# Patient Record
Sex: Female | Born: 1975 | Race: White | Hispanic: No | Marital: Single | Smoking: Current every day smoker
Health system: Southern US, Community
[De-identification: ages and names within clinical notes are randomized; demographics above are authoritative.]

## PROBLEM LIST (undated history)

## (undated) DIAGNOSIS — F419 Anxiety disorder, unspecified: Secondary | ICD-10-CM

## (undated) DIAGNOSIS — J449 Chronic obstructive pulmonary disease, unspecified: Secondary | ICD-10-CM

## (undated) DIAGNOSIS — I671 Cerebral aneurysm, nonruptured: Secondary | ICD-10-CM

## (undated) DIAGNOSIS — M797 Fibromyalgia: Secondary | ICD-10-CM

## (undated) DIAGNOSIS — F32A Depression, unspecified: Secondary | ICD-10-CM

## (undated) DIAGNOSIS — IMO0001 Reserved for inherently not codable concepts without codable children: Secondary | ICD-10-CM

## (undated) DIAGNOSIS — R569 Unspecified convulsions: Secondary | ICD-10-CM

## (undated) DIAGNOSIS — R471 Dysarthria and anarthria: Secondary | ICD-10-CM

## (undated) DIAGNOSIS — Z720 Tobacco use: Secondary | ICD-10-CM

## (undated) DIAGNOSIS — I10 Essential (primary) hypertension: Secondary | ICD-10-CM

## (undated) DIAGNOSIS — R519 Headache, unspecified: Secondary | ICD-10-CM

## (undated) DIAGNOSIS — R51 Headache: Secondary | ICD-10-CM

## (undated) DIAGNOSIS — K219 Gastro-esophageal reflux disease without esophagitis: Secondary | ICD-10-CM

## (undated) DIAGNOSIS — I639 Cerebral infarction, unspecified: Secondary | ICD-10-CM

## (undated) DIAGNOSIS — R270 Ataxia, unspecified: Secondary | ICD-10-CM

## (undated) DIAGNOSIS — F329 Major depressive disorder, single episode, unspecified: Secondary | ICD-10-CM

## (undated) HISTORY — PX: ROBOTIC ASSISTED TOTAL HYSTERECTOMY WITH SACROCOLPOPEXY: SHX6799

## (undated) HISTORY — PX: ABDOMINAL HYSTERECTOMY: SHX81

## (undated) HISTORY — PX: BRAIN SURGERY: SHX531

---

## 2004-08-14 ENCOUNTER — Emergency Department (HOSPITAL_COMMUNITY): Admission: EM | Admit: 2004-08-14 | Discharge: 2004-08-14 | Payer: Self-pay | Admitting: Emergency Medicine

## 2004-08-15 ENCOUNTER — Emergency Department (HOSPITAL_COMMUNITY): Admission: EM | Admit: 2004-08-15 | Discharge: 2004-08-15 | Payer: Self-pay | Admitting: Emergency Medicine

## 2004-08-15 ENCOUNTER — Inpatient Hospital Stay (HOSPITAL_COMMUNITY): Admission: AD | Admit: 2004-08-15 | Discharge: 2004-08-16 | Payer: Self-pay | Admitting: Obstetrics and Gynecology

## 2004-11-11 ENCOUNTER — Emergency Department (HOSPITAL_COMMUNITY): Admission: EM | Admit: 2004-11-11 | Discharge: 2004-11-11 | Payer: Self-pay | Admitting: Family Medicine

## 2004-11-12 ENCOUNTER — Encounter: Admission: RE | Admit: 2004-11-12 | Discharge: 2004-11-12 | Payer: Self-pay | Admitting: Sports Medicine

## 2006-10-05 ENCOUNTER — Emergency Department: Payer: Self-pay | Admitting: Emergency Medicine

## 2006-11-06 ENCOUNTER — Emergency Department: Payer: Self-pay | Admitting: Unknown Physician Specialty

## 2006-12-31 ENCOUNTER — Inpatient Hospital Stay: Payer: Self-pay | Admitting: Internal Medicine

## 2007-02-10 ENCOUNTER — Emergency Department: Payer: Self-pay | Admitting: Emergency Medicine

## 2007-04-05 ENCOUNTER — Emergency Department: Payer: Self-pay | Admitting: Emergency Medicine

## 2007-05-17 ENCOUNTER — Emergency Department: Payer: Self-pay | Admitting: Emergency Medicine

## 2007-05-17 ENCOUNTER — Other Ambulatory Visit: Payer: Self-pay

## 2007-05-18 ENCOUNTER — Emergency Department: Payer: Self-pay | Admitting: Emergency Medicine

## 2007-06-05 ENCOUNTER — Emergency Department: Payer: Self-pay | Admitting: Emergency Medicine

## 2007-07-11 ENCOUNTER — Emergency Department: Payer: Self-pay | Admitting: Emergency Medicine

## 2007-07-11 ENCOUNTER — Other Ambulatory Visit: Payer: Self-pay

## 2007-09-19 ENCOUNTER — Emergency Department: Payer: Self-pay | Admitting: Emergency Medicine

## 2008-01-11 ENCOUNTER — Emergency Department: Payer: Self-pay | Admitting: Emergency Medicine

## 2008-01-12 ENCOUNTER — Emergency Department: Payer: Self-pay | Admitting: Emergency Medicine

## 2008-02-19 ENCOUNTER — Emergency Department: Payer: Self-pay | Admitting: Emergency Medicine

## 2008-02-29 ENCOUNTER — Emergency Department: Payer: Self-pay | Admitting: Emergency Medicine

## 2008-04-12 ENCOUNTER — Emergency Department: Payer: Self-pay | Admitting: Emergency Medicine

## 2008-06-03 ENCOUNTER — Emergency Department: Payer: Self-pay | Admitting: Emergency Medicine

## 2008-10-30 ENCOUNTER — Emergency Department: Payer: Self-pay

## 2008-12-24 ENCOUNTER — Emergency Department: Payer: Self-pay | Admitting: Emergency Medicine

## 2009-01-03 ENCOUNTER — Ambulatory Visit: Payer: Self-pay | Admitting: Family Medicine

## 2009-01-10 ENCOUNTER — Ambulatory Visit: Payer: Self-pay

## 2009-01-16 ENCOUNTER — Ambulatory Visit: Payer: Self-pay

## 2009-01-23 ENCOUNTER — Ambulatory Visit: Payer: Self-pay

## 2009-01-23 ENCOUNTER — Emergency Department: Payer: Self-pay | Admitting: Unknown Physician Specialty

## 2009-01-31 ENCOUNTER — Ambulatory Visit: Payer: Self-pay

## 2009-02-13 ENCOUNTER — Emergency Department: Payer: Self-pay | Admitting: Emergency Medicine

## 2009-02-21 ENCOUNTER — Ambulatory Visit: Payer: Self-pay

## 2009-04-24 ENCOUNTER — Emergency Department: Payer: Self-pay | Admitting: Emergency Medicine

## 2009-05-16 ENCOUNTER — Emergency Department: Payer: Self-pay | Admitting: Emergency Medicine

## 2009-07-16 ENCOUNTER — Emergency Department: Payer: Self-pay | Admitting: Emergency Medicine

## 2009-10-03 ENCOUNTER — Emergency Department: Payer: Self-pay | Admitting: Emergency Medicine

## 2009-11-03 ENCOUNTER — Emergency Department: Payer: Self-pay | Admitting: Emergency Medicine

## 2009-11-05 ENCOUNTER — Inpatient Hospital Stay: Payer: Self-pay | Admitting: Specialist

## 2009-11-08 ENCOUNTER — Emergency Department: Payer: Self-pay | Admitting: Emergency Medicine

## 2010-02-12 ENCOUNTER — Other Ambulatory Visit: Payer: Self-pay | Admitting: Neurology

## 2010-03-08 ENCOUNTER — Emergency Department: Payer: Self-pay | Admitting: Emergency Medicine

## 2010-04-23 ENCOUNTER — Emergency Department: Payer: Self-pay | Admitting: Internal Medicine

## 2010-07-03 ENCOUNTER — Emergency Department: Payer: Self-pay | Admitting: Unknown Physician Specialty

## 2011-02-18 ENCOUNTER — Encounter: Payer: Self-pay | Admitting: Family Medicine

## 2011-08-14 IMAGING — CR DG CHEST 2V
1 series · 2 of 2 positions shown · non-contrast
Comparison: none

REASON FOR EXAM: cough
COMMENTS:

[Series 1: view not recorded · 0.17mm/px · 2 of 2 slices shown]
[im 1/2]
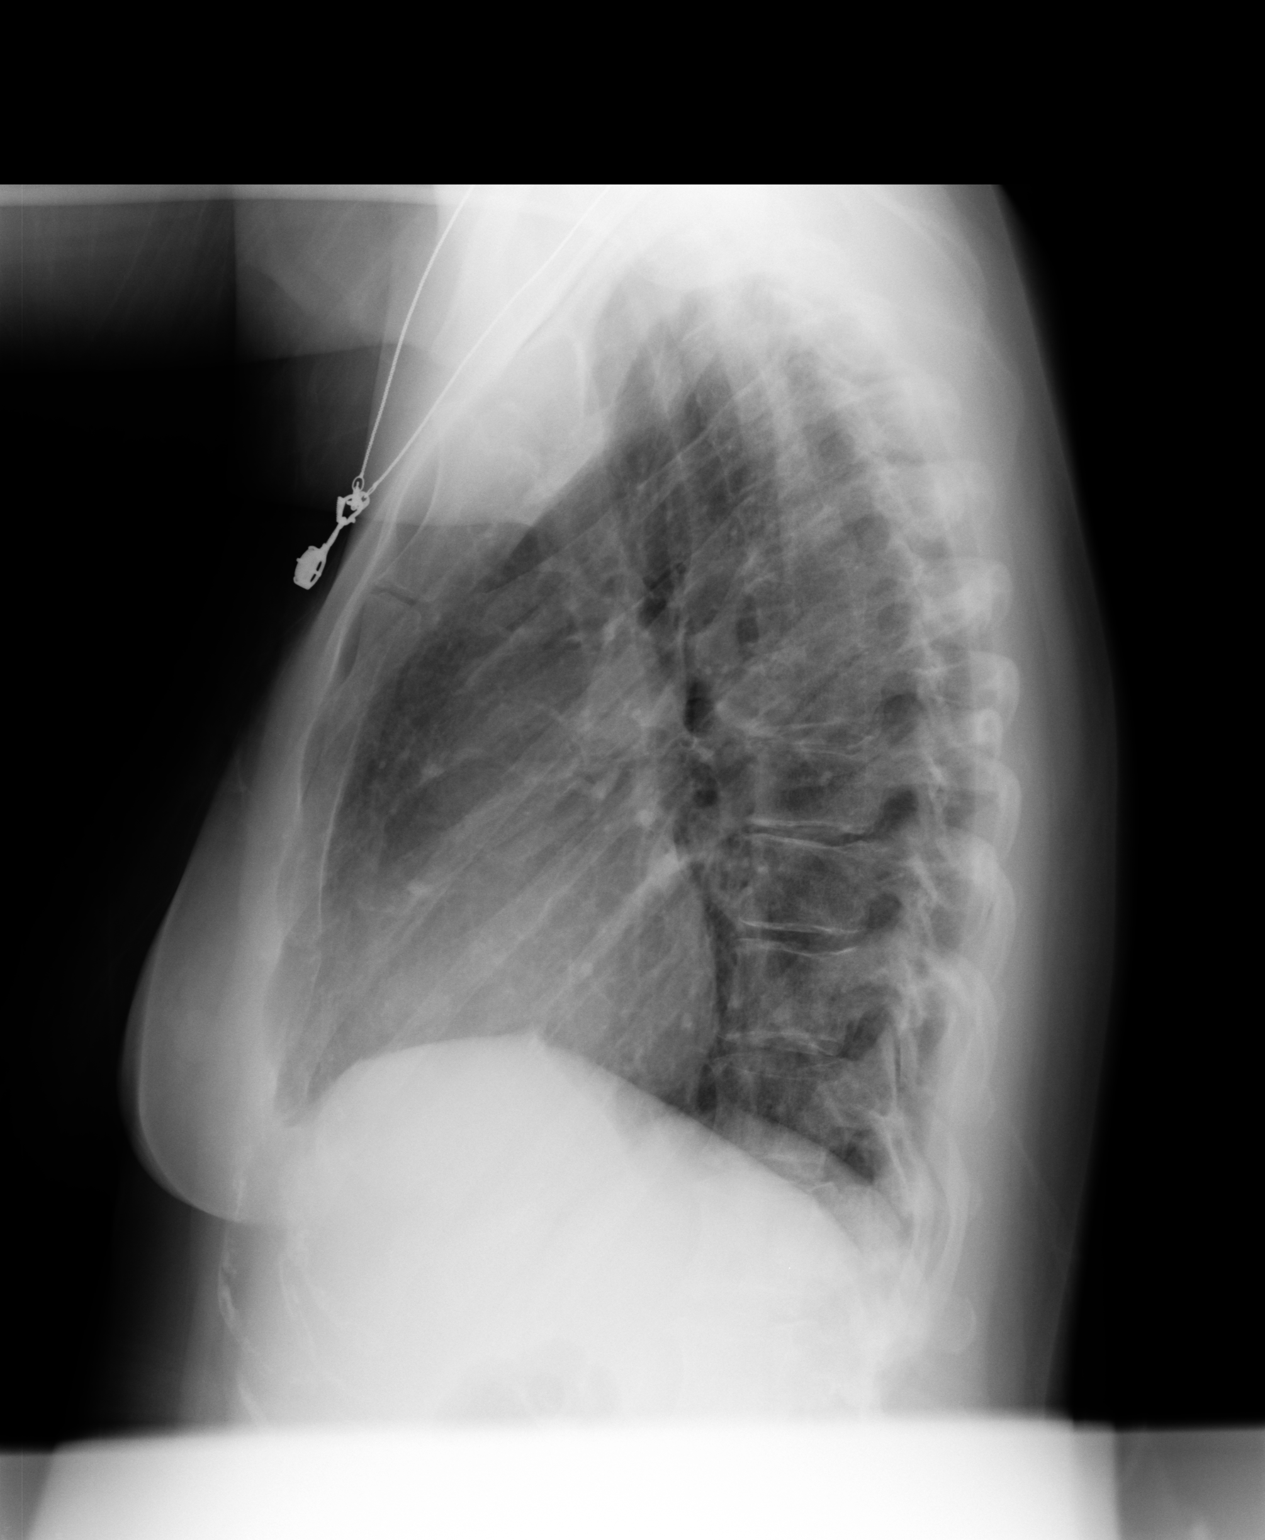
[im 2/2]
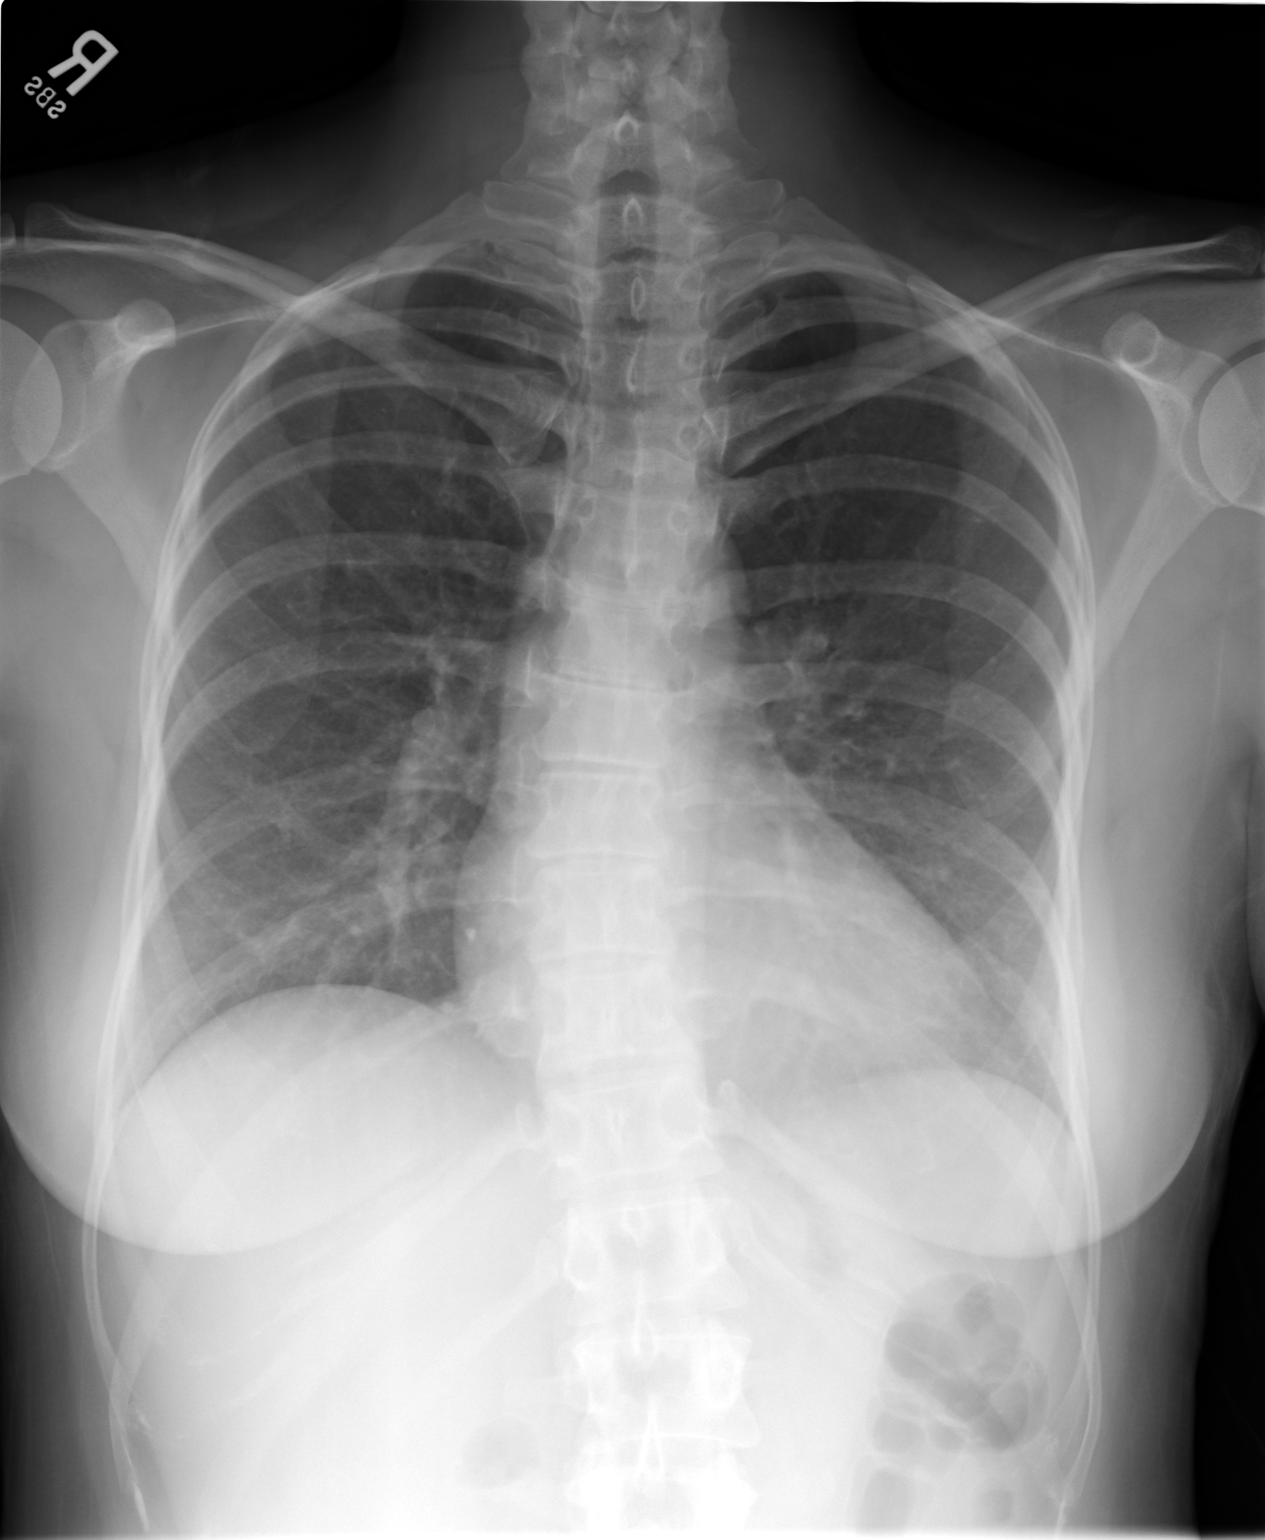

[2 of 2 positions shown; findings below may reference images not displayed]

PROCEDURE:     DXR - DXR CHEST PA (OR AP) AND LATERAL  - February 13, 2009  [DATE]

RESULT:     Comparison is made to the prior exam 10/30/2008. The current exam
shows the lung fields to be clear. The previously noted minimal infiltrate
at the right base is no longer seen. The heart is upper limits for normal in
size or mildly enlarged. No interstitial or pulmonary edema is noted. There
is a mild thoracic scoliosis with a convexity to the right.
IMPRESSION: No acute changes are identified.

## 2011-09-22 ENCOUNTER — Emergency Department: Payer: Self-pay | Admitting: Emergency Medicine

## 2011-09-22 LAB — CBC
HCT: 42.2 % (ref 35.0–47.0)
HGB: 14.2 g/dL (ref 12.0–16.0)
RDW: 15.2 % — ABNORMAL HIGH (ref 11.5–14.5)
WBC: 11 10*3/uL (ref 3.6–11.0)

## 2011-09-22 LAB — DRUG SCREEN, URINE
Barbiturates, Ur Screen: NEGATIVE (ref ?–200)
Cannabinoid 50 Ng, Ur ~~LOC~~: NEGATIVE (ref ?–50)
Cocaine Metabolite,Ur ~~LOC~~: NEGATIVE (ref ?–300)
Opiate, Ur Screen: NEGATIVE (ref ?–300)

## 2011-09-22 LAB — ETHANOL: Ethanol %: <0.003 % (ref 0.000–0.080)

## 2011-09-22 LAB — URINALYSIS, COMPLETE
Leukocyte Esterase: NEGATIVE
Nitrite: NEGATIVE
Ph: 5 (ref 4.5–8.0)
RBC,UR: <1 /HPF (ref 0–5)

## 2011-09-22 LAB — SALICYLATE LEVEL: Salicylates, Serum: 4.1 mg/dL — ABNORMAL HIGH

## 2011-09-22 LAB — COMPREHENSIVE METABOLIC PANEL
Alkaline Phosphatase: 62 U/L (ref 50–136)
Anion Gap: 9 (ref 7–16)
Calcium, Total: 9 mg/dL (ref 8.5–10.1)
Co2: 30 mmol/L (ref 21–32)
EGFR (African American): >60
Glucose: 88 mg/dL (ref 65–99)
Osmolality: 277 (ref 275–301)
SGOT(AST): 20 U/L (ref 15–37)
SGPT (ALT): 23 U/L

## 2011-09-22 LAB — ACETAMINOPHEN LEVEL: Acetaminophen: <2 ug/mL

## 2011-09-22 LAB — VALPROIC ACID LEVEL: Valproic Acid: <3 ug/mL — ABNORMAL LOW

## 2012-03-07 ENCOUNTER — Emergency Department: Payer: Self-pay | Admitting: Emergency Medicine

## 2012-03-07 LAB — VALPROIC ACID LEVEL: Valproic Acid: <3 ug/mL — ABNORMAL LOW

## 2012-03-07 LAB — BASIC METABOLIC PANEL
Anion Gap: 6 — ABNORMAL LOW (ref 7–16)
BUN: 9 mg/dL (ref 7–18)
Chloride: 102 mmol/L (ref 98–107)
Co2: 27 mmol/L (ref 21–32)
Creatinine: 0.74 mg/dL (ref 0.60–1.30)
Osmolality: 268 (ref 275–301)
Potassium: 3.2 mmol/L — ABNORMAL LOW (ref 3.5–5.1)
Sodium: 135 mmol/L — ABNORMAL LOW (ref 136–145)

## 2012-03-07 LAB — CBC WITH DIFFERENTIAL/PLATELET
Basophil #: 0 10*3/uL (ref 0.0–0.1)
HGB: 13.7 g/dL (ref 12.0–16.0)
MCV: 87 fL (ref 80–100)
Monocyte #: 0.6 x10 3/mm (ref 0.2–0.9)
Neutrophil %: 57.9 %
Platelet: 270 10*3/uL (ref 150–440)
RDW: 14.7 % — ABNORMAL HIGH (ref 11.5–14.5)

## 2012-08-10 ENCOUNTER — Emergency Department: Payer: Self-pay | Admitting: Emergency Medicine

## 2012-08-10 LAB — CBC
RBC: 4.68 10*6/uL (ref 3.80–5.20)
WBC: 10 10*3/uL (ref 3.6–11.0)

## 2012-08-10 LAB — BASIC METABOLIC PANEL
Anion Gap: 11 (ref 7–16)
Calcium, Total: 9 mg/dL (ref 8.5–10.1)
Chloride: 103 mmol/L (ref 98–107)
Co2: 23 mmol/L (ref 21–32)
Glucose: 152 mg/dL — ABNORMAL HIGH (ref 65–99)
Osmolality: 277 (ref 275–301)
Potassium: 3.4 mmol/L — ABNORMAL LOW (ref 3.5–5.1)

## 2012-08-10 LAB — TROPONIN I: Troponin-I: <0.02 ng/mL

## 2012-09-07 ENCOUNTER — Emergency Department: Payer: Self-pay | Admitting: Emergency Medicine

## 2012-10-19 ENCOUNTER — Ambulatory Visit: Payer: Self-pay | Admitting: Family Medicine

## 2013-05-30 ENCOUNTER — Ambulatory Visit: Payer: Self-pay | Admitting: Emergency Medicine

## 2013-09-10 ENCOUNTER — Emergency Department: Payer: Self-pay | Admitting: Internal Medicine

## 2013-09-10 LAB — BASIC METABOLIC PANEL
Anion Gap: 6 — ABNORMAL LOW (ref 7–16)
BUN: 8 mg/dL (ref 7–18)
CALCIUM: 8.9 mg/dL (ref 8.5–10.1)
CO2: 29 mmol/L (ref 21–32)
CREATININE: 0.79 mg/dL (ref 0.60–1.30)
Chloride: 102 mmol/L (ref 98–107)
EGFR (African American): >60
GLUCOSE: 98 mg/dL (ref 65–99)
Osmolality: 272 (ref 275–301)
POTASSIUM: 3.7 mmol/L (ref 3.5–5.1)
Sodium: 137 mmol/L (ref 136–145)

## 2013-09-10 LAB — CBC
HCT: 43.2 % (ref 35.0–47.0)
HGB: 14.7 g/dL (ref 12.0–16.0)
MCH: 29.9 pg (ref 26.0–34.0)
MCHC: 33.9 g/dL (ref 32.0–36.0)
MCV: 88 fL (ref 80–100)
Platelet: 303 10*3/uL (ref 150–440)
RBC: 4.9 10*6/uL (ref 3.80–5.20)
RDW: 13.9 % (ref 11.5–14.5)
WBC: 8.3 10*3/uL (ref 3.6–11.0)

## 2013-09-10 LAB — TROPONIN I: Troponin-I: <0.02 ng/mL

## 2013-09-10 LAB — D-DIMER(ARMC): D-Dimer: 351 ng/ml

## 2015-05-26 ENCOUNTER — Encounter: Payer: Self-pay | Admitting: Emergency Medicine

## 2015-05-26 ENCOUNTER — Ambulatory Visit
Admission: EM | Admit: 2015-05-26 | Discharge: 2015-05-26 | Disposition: A | Discharge status: Home / Self Care | Payer: Medicaid Other | Attending: Internal Medicine | Admitting: Internal Medicine

## 2015-05-26 DIAGNOSIS — J209 Acute bronchitis, unspecified: Secondary | ICD-10-CM

## 2015-05-26 DIAGNOSIS — R52 Pain, unspecified: Secondary | ICD-10-CM | POA: Diagnosis not present

## 2015-05-26 DIAGNOSIS — M791 Myalgia: Secondary | ICD-10-CM | POA: Diagnosis not present

## 2015-05-26 DIAGNOSIS — H65111 Acute and subacute allergic otitis media (mucoid) (sanguinous) (serous), right ear: Secondary | ICD-10-CM

## 2015-05-26 DIAGNOSIS — M7918 Myalgia, other site: Secondary | ICD-10-CM

## 2015-05-26 HISTORY — DX: Unspecified convulsions: R56.9

## 2015-05-26 MED ORDER — CYCLOBENZAPRINE HCL 5 MG PO TABS: 10.0000 mg | ORAL_TABLET | Freq: Three times a day (TID) | ORAL | Status: AC | PRN

## 2015-05-26 MED ORDER — METHYLPREDNISOLONE SODIUM SUCC 125 MG IJ SOLR: 80.0000 mg | Freq: Once | INTRAMUSCULAR | Status: DC

## 2015-05-26 MED ORDER — AZITHROMYCIN 250 MG PO TABS: 250.0000 mg | ORAL_TABLET | Freq: Every day | ORAL | Status: AC

## 2015-05-26 MED ORDER — METHYLPREDNISOLONE SODIUM SUCC 125 MG IJ SOLR
80.0000 mg | Freq: Once | INTRAMUSCULAR | Status: AC
  Administered 2015-05-26: 80 mg via INTRAVENOUS

## 2015-05-26 MED ORDER — METHYLPREDNISOLONE ACETATE 80 MG/ML IJ SUSP: 80.0000 mg | Freq: Once | INTRAMUSCULAR | Status: DC

## 2015-05-26 NOTE — Discharge Instructions (Signed)
Prescriptions for zithromax (antibiotic) and cyclobenzaprine (muscle relaxer) were sent to the [facility] [place].  An injection of solumedrol (methylprednisolone, a steroid) was given at the urgent care for bronchospasm. Recheck or followup pcp/Dr Mariana Kaufmanobin for persistent cough/wheezing, new fever >100.5, increasing phlegm production. Anticipate gradual improvement in achiness of neck/shoulders over the next several days.

## 2015-05-26 NOTE — ED Notes (Signed)
Patient c/o sinus pain and pressure for a week.  Patient c/o neck pain and stiffness for the past 4 days.

## 2015-05-26 NOTE — ED Provider Notes (Signed)
CSN: [id number]     Arrival date & time 05/26/15  1057 History   First MD Initiated Contact with Patient 05/26/15 1122     Chief Complaint  Patient presents with  . Neck Pain  . Facial Pain   HPI  40 yo lady with 4d hx respiratory sx's, prod cough, tactile temp, burning intranasal sensation (?dryness), some runny nose.  No sore throat.  Headache, some photophobia.  R neck/shoulder discomfort/achiness.   No N/V, no diarrhea; 2 episodes of fecal incontinence with hard coughing yesterday.  Past Medical History  Diagnosis Date  . Seizure [facility](HCC)    Past Surgical History  Procedure Laterality Date  . Brain surgery     History reviewed. No pertinent family history. Social History  Substance Use Topics  . Smoking status: Current Every Day Smoker    Types: Cigarettes  . Smokeless tobacco: None  . Alcohol Use: No    Review of Systems  All other systems reviewed and are negative.   Allergies  Chantix and Spiriva handihaler  Home Medications   Prior to Admission medications   Medication Sig Start Date End Date Taking? Authorizing Provider  clonazePAM (KLONOPIN) 0.5 MG tablet Take 0.5 mg by mouth at bedtime.   Yes Historical Provider, MD  hydrOXYzine (ATARAX/VISTARIL) 25 MG tablet Take 25 mg by mouth 2 (two) times daily.   Yes Historical Provider, MD  ibuprofen (ADVIL,MOTRIN) 800 MG tablet Take 800 mg by mouth daily.   Yes Historical Provider, MD  levETIRAcetam (KEPPRA) 750 MG tablet Take 1,500 mg by mouth daily.   Yes Historical Provider, MD  pregabalin (LYRICA) 100 MG capsule Take 100 mg by mouth 2 (two) times daily.   Yes Historical Provider, MD  propranolol (INDERAL) 20 MG tablet Take 20 mg by mouth daily.   Yes Historical Provider, MD  tiZANidine (ZANAFLEX) 2 MG tablet Take 2 mg by mouth once.   Yes Historical Provider, MD  traMADol (ULTRAM) 50 MG tablet Take 50 mg by mouth daily.   Yes Historical Provider, MD  cyclobenzaprine (FLEXERIL) 5 MG tablet Take 2 tablets (10 mg total)  by mouth 3 (three) times daily as needed for muscle spasms. 05/26/15 06/05/15  Eustace MooreLaura W Ellen Goris, MD   Meds Ordered and Administered this Visit   Medications  methylPREDNISolone sodium succinate (SOLU-MEDROL) 125 mg/2 mL injection 80 mg (80 mg Intravenous Given 05/26/15 1154)  Note:  Solumedrol was given IM not iv   BP 132/88 mmHg  Pulse 83  Temp(Src) 98.2 F (36.8 C) (Tympanic)  Resp 16  Ht 5\' 9"  (1.753 m)  Wt 205 lb (92.987 kg)  BMI 30.26 kg/m2  SpO2 95%  LMP 05/26/2015 (Approximate)  Physical Exam  Constitutional: She is oriented to person, place, and time.  Alert, nicely groomed Laying down on exam table but able to sit up for exam Looks ill but not toxic  HENT:  Head: Atraumatic.  R TM red/dull L TM dull Mod nasal congestion Throat slightly red   Eyes:  Dysconjugate gaze, R eye deviates laterally; no eye redness/drainage  Neck:  Hurts to turn neck to right Able to flex/extend neck but uncomfortable No rash Diffuse R paracervical/R trapezius discomfort to palpation  Cardiovascular: Normal rate and regular rhythm.   Pulmonary/Chest: No respiratory distress. She has no wheezes. She has no rales.  Coarse but symmetric breath sounds throughoug  Abdominal: She exhibits no distension.  Musculoskeletal: Normal range of motion.  No leg swelling  Neurological: She is alert and oriented to person,  place, and time.  Skin: Skin is warm and dry.  No cyanosis  Nursing note and vitals reviewed.   ED Course  Procedures (including critical care time)  None today  MDM   1. Acute bronchitis, unspecified organism   2. Acute mucoid otitis media of right ear   3. Acute myofascial pain     Meds this encounter   . methylPREDNISolone acetate (DEPO-MEDROL) injection 80 mg      . cyclobenzaprine (FLEXERIL) 5 MG tablet    Sig: Take 2 tablets (10 mg total) by mouth 3 (three) times daily as needed for muscle spasms.    Dispense:  30 tablet    Refill:  0  . azithromycin  (ZITHROMAX) 250 MG tablet    Sig: Take 1 tablet (250 mg total) by mouth daily. Take first 2 tablets together, then 1 every day until finished.    Dispense:  6 tablet    Refill:  0   Recheck or followup pcp/Dr Mariana Kaufman for new fever >100.5, increasing phlegm production, or persistent cough/wheezing. Anticipate gradual improvement in achiness of neck/shoulders over the next several days.  Eustace Moore, MD 06/01/15 [phone]

## 2015-10-12 ENCOUNTER — Ambulatory Visit: Payer: Medicare Other

## 2015-10-12 ENCOUNTER — Encounter: Payer: Self-pay | Admitting: Gynecology

## 2015-10-12 ENCOUNTER — Ambulatory Visit
Admission: EM | Admit: 2015-10-12 | Discharge: 2015-10-12 | Disposition: A | Discharge status: Home / Self Care | Payer: Medicare Other | Attending: Family Medicine | Admitting: Family Medicine

## 2015-10-12 DIAGNOSIS — M797 Fibromyalgia: Secondary | ICD-10-CM | POA: Diagnosis not present

## 2015-10-12 DIAGNOSIS — G40909 Epilepsy, unspecified, not intractable, without status epilepticus: Secondary | ICD-10-CM | POA: Insufficient documentation

## 2015-10-12 DIAGNOSIS — F17210 Nicotine dependence, cigarettes, uncomplicated: Secondary | ICD-10-CM | POA: Insufficient documentation

## 2015-10-12 DIAGNOSIS — K219 Gastro-esophageal reflux disease without esophagitis: Secondary | ICD-10-CM | POA: Insufficient documentation

## 2015-10-12 DIAGNOSIS — R05 Cough: Secondary | ICD-10-CM | POA: Diagnosis present

## 2015-10-12 DIAGNOSIS — F419 Anxiety disorder, unspecified: Secondary | ICD-10-CM | POA: Diagnosis not present

## 2015-10-12 DIAGNOSIS — F329 Major depressive disorder, single episode, unspecified: Secondary | ICD-10-CM | POA: Insufficient documentation

## 2015-10-12 DIAGNOSIS — Z79899 Other long term (current) drug therapy: Secondary | ICD-10-CM | POA: Diagnosis not present

## 2015-10-12 DIAGNOSIS — J449 Chronic obstructive pulmonary disease, unspecified: Secondary | ICD-10-CM | POA: Insufficient documentation

## 2015-10-12 DIAGNOSIS — Z7982 Long term (current) use of aspirin: Secondary | ICD-10-CM | POA: Insufficient documentation

## 2015-10-12 DIAGNOSIS — J069 Acute upper respiratory infection, unspecified: Secondary | ICD-10-CM | POA: Insufficient documentation

## 2015-10-12 DIAGNOSIS — Z8673 Personal history of transient ischemic attack (TIA), and cerebral infarction without residual deficits: Secondary | ICD-10-CM | POA: Insufficient documentation

## 2015-10-12 HISTORY — DX: Essential (primary) hypertension: I10

## 2015-10-12 HISTORY — DX: Depression, unspecified: F32.A

## 2015-10-12 HISTORY — DX: Anxiety disorder, unspecified: F41.9

## 2015-10-12 HISTORY — DX: Cerebral infarction, unspecified: I63.9

## 2015-10-12 HISTORY — DX: Gastro-esophageal reflux disease without esophagitis: K21.9

## 2015-10-12 HISTORY — DX: Headache: R51

## 2015-10-12 HISTORY — DX: Chronic obstructive pulmonary disease, unspecified: J44.9

## 2015-10-12 HISTORY — DX: Headache, unspecified: R51.9

## 2015-10-12 HISTORY — DX: Reserved for inherently not codable concepts without codable children: IMO0001

## 2015-10-12 HISTORY — DX: Cerebral aneurysm, nonruptured: I67.1

## 2015-10-12 HISTORY — DX: Fibromyalgia: M79.7

## 2015-10-12 HISTORY — DX: Ataxia, unspecified: R27.0

## 2015-10-12 HISTORY — DX: Dysarthria and anarthria: R47.1

## 2015-10-12 HISTORY — DX: Tobacco use: Z72.0

## 2015-10-12 HISTORY — DX: Major depressive disorder, single episode, unspecified: F32.9

## 2015-10-12 MED ORDER — METHYLPREDNISOLONE 4 MG PO TBPK: ORAL_TABLET | ORAL | 0 refills | Status: DC

## 2015-10-12 NOTE — ED Provider Notes (Signed)
[facility]    CSN: [id number] Arrival date & time: 10/12/15  1233  First Provider Contact:  None       History   Chief Complaint Chief Complaint  Patient presents with  . Cough    HPI Tona Sensingita L Viveros is a 40 y.o. female.   HPI: Patient presents today with symptoms of persistent productive cough. Patient has been treated with Augmentin and Zithromax for bronchitis. Patient states that she has a history of COPD. She is a smoker. She denies any chest pain or increased shortness of breath. She admits to minimal nasal drainage and no sore throat. She believes she has had a low-grade fever. No lower extremity edema or pain. She has been taking her inhalers as prescribed.  Past Medical History:  Diagnosis Date  . Aneurysm of anterior cerebral artery   . Anxiety   . Ataxia   . COPD (chronic obstructive pulmonary disease) (HCC)   . Depression   . Dysarthria   . Epilepsy (HCC)   . Fibromyalgia   . GERD (gastroesophageal reflux disease)   . Head ache   . Hypertension   . Myalgia and myositis   . Seizure (HCC)   . Stroke (HCC)   . Tobacco abuse     There are no active problems to display for this patient.   Past Surgical History:  Procedure Laterality Date  . BRAIN SURGERY      OB History    No data available       Home Medications    Prior to Admission medications   Medication Sig Start Date End Date Taking? Authorizing Provider  albuterol (ACCUNEB) 0.63 MG/3ML nebulizer solution Take 1 ampule by nebulization every 6 (six) hours as needed for wheezing.   Yes Historical Provider, MD  albuterol (PROAIR HFA) 108 (90 Base) MCG/ACT inhaler Inhale into the lungs every 6 (six) hours as needed for wheezing or shortness of breath.   Yes Historical Provider, MD  albuterol (PROVENTIL) (2.5 MG/3ML) 0.083% nebulizer solution Take 2.5 mg by nebulization every 6 (six) hours as needed for wheezing or shortness of breath.   Yes Historical Provider, MD  aspirin 325 MG  tablet Take 325 mg by mouth daily.   Yes Historical Provider, MD  cetirizine (ZYRTEC) 10 MG tablet Take 10 mg by mouth daily.   Yes Historical Provider, MD  clonazePAM (KLONOPIN) 0.5 MG tablet Take 0.5 mg by mouth at bedtime.   Yes Historical Provider, MD  fluticasone (FLOVENT HFA) 110 MCG/ACT inhaler Inhale into the lungs 2 (two) times daily.   Yes Historical Provider, MD  furosemide (LASIX) 40 MG tablet Take 40 mg by mouth.   Yes Historical Provider, MD  hydrOXYzine (ATARAX/VISTARIL) 25 MG tablet Take 25 mg by mouth 2 (two) times daily.   Yes Historical Provider, MD  ibuprofen (ADVIL,MOTRIN) 800 MG tablet Take 800 mg by mouth daily.   Yes Historical Provider, MD  ketoconazole (NIZORAL) 2 % cream Apply 1 application topically daily.   Yes Historical Provider, MD  levETIRAcetam (KEPPRA) 750 MG tablet Take 1,500 mg by mouth daily.   Yes Historical Provider, MD  pantoprazole (PROTONIX) 40 MG tablet Take 40 mg by mouth daily.   Yes Historical Provider, MD  pregabalin (LYRICA) 100 MG capsule Take 100 mg by mouth 2 (two) times daily.   Yes Historical Provider, MD  propranolol (INDERAL) 20 MG tablet Take 20 mg by mouth daily.   Yes Historical Provider, MD  sertraline (ZOLOFT) 25 MG tablet Take  25 mg by mouth daily.   Yes Historical Provider, MD  tiZANidine (ZANAFLEX) 2 MG tablet Take 2 mg by mouth once.   Yes Historical Provider, MD  traMADol (ULTRAM) 50 MG tablet Take 50 mg by mouth daily.   Yes Historical Provider, MD    Family History No family history on file.  Social History Social History  Substance Use Topics  . Smoking status: Current Every Day Smoker    Types: Cigarettes  . Smokeless tobacco: Never Used  . Alcohol use No     Allergies   Chantix [varenicline] and Spiriva handihaler [tiotropium bromide monohydrate]   Review of Systems Review of Systems: Negative except mentioned above.   Physical Exam Triage Vital Signs ED Triage Vitals  Enc Vitals Group     BP 10/12/15  1310 121/77     Pulse Rate 10/12/15 1310 100     Resp 10/12/15 1310 16     Temp 10/12/15 1310 99 F (37.2 C)     Temp Source 10/12/15 1310 Oral     SpO2 10/12/15 1310 98 %     Weight 10/12/15 1312 212 lb (96.2 kg)     Height 10/12/15 1312 5\' 8"  (1.727 m)     Head Circumference --      Peak Flow --      Pain Score 10/12/15 1320 9     Pain Loc --      Pain Edu? --      Excl. in GC? --    No data found.   Updated Vital Signs BP 121/77 (BP Location: Left Arm)   Pulse 100   Temp 99 F (37.2 C) (Oral)   Resp 16   Ht 5\' 8"  (1.727 m)   Wt 212 lb (96.2 kg)   LMP 09/08/2015 Comment: irregular periods  SpO2 98%   BMI 32.23 kg/m      Physical Exam:   GENERAL: NAD HEENT: mild pharyngeal erythema, no exudate, no erythema of TMs, no cervical LAD RESP: minimal expiratory wheeze at bases, no tachypnea, no accessory muscle use CARD: RRR EXTREM: -Homans    UC Treatments / Results  Labs (all labs ordered are listed, but only abnormal results are displayed) Labs Reviewed - No data to display  EKG  EKG Interpretation None       Radiology No results found.  Procedures Procedures (including critical care time)  Medications Ordered in UC Medications - No data to display   Initial Impression / Assessment and Plan / UC Course  I have reviewed the triage vital signs and the nursing notes.  Pertinent labs & imaging results that were available during my care of the patient were reviewed by me and considered in my medical decision making (see chart for details).  Clinical Course   A/P: URI, COPD - discussed with patient results of the x-ray, recommend patient monitor fever, no new antibiotic was given at this time, if patient's symptoms do worsen I do recommend that she follow up with her primary care physician or go to the ER, will prescribe patient tapered dose of prednisone, over-the-counter cough suppressant when necessary, continue to use her inhalers as prescribed,  smoking cessation encouraged.  Final Clinical Impressions(s) / UC Diagnoses   Final diagnoses:  None    New Prescriptions New Prescriptions   No medications on file     Jolene ProvostKirtida Karra Pink, MD 10/12/15 1437

## 2015-10-12 NOTE — ED Triage Notes (Signed)
Patient c/o was seen by her doctor on 09/26/2015 and dx with acute bronchitis. Per patient given antibiotics which she finished on 8/13l17. Patient stated still using the inhales that was given to her. However patient stated coughing thick green mucous.

## 2015-10-12 NOTE — Discharge Instructions (Signed)
Follow-up with her primary care physician as discussed. Continue using her inhalers as prescribed. Smoking cessation encouraged. Seek immediate medical attention if symptoms worsen as discussed

## 2015-12-08 ENCOUNTER — Telehealth: Payer: Self-pay | Admitting: Gynecology

## 2015-12-08 ENCOUNTER — Ambulatory Visit
Admission: EM | Admit: 2015-12-08 | Discharge: 2015-12-08 | Disposition: A | Discharge status: Home / Self Care | Payer: Medicare Other | Attending: Family Medicine | Admitting: Family Medicine

## 2015-12-08 ENCOUNTER — Encounter: Payer: Self-pay | Admitting: *Deleted

## 2015-12-08 DIAGNOSIS — R1030 Lower abdominal pain, unspecified: Secondary | ICD-10-CM | POA: Diagnosis not present

## 2015-12-08 DIAGNOSIS — R197 Diarrhea, unspecified: Secondary | ICD-10-CM

## 2015-12-08 NOTE — ED Provider Notes (Signed)
[facility] ____________________________________________  Time seen: Approximately 2:05 PM  I have reviewed the triage vital signs and the nursing notes.   HISTORY Chief Complaint Hematochezia and Abdominal Pain  HPI Anna Ayala is a 40 y.o. female presents with husband at bedside for the complaints of lower abdominal pain as well has cutting diarrhea and blood in stool. Patient reports is been present for the last 2 days. Patient reports last night she had more than 10 diarrheal episodes, and reports 6-7 diarrheal episodes today. Reports fall diarrhea episodes have blood in stool as well as blood on the toilet. Patient reports "chunks of blood" in the toilet. Denies history of this extensive bleeding in toilet or stool. Patient reports she does have a history of diverticulitis with similar abdominal pain however states that this is more diffuse across her abdomen. Patient states that she called her primary doctor on call and they recommend for her to be evaluated. Patient states that she is here as she feels that she needs an antibiotic for diverticulitis.   patient states current abdominal pain is 7 out of 10 across lower abdomen. Denies pain radiation. 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 Denies any trauma from a seizure. Denies any recent medication changes. Denies any known triggers. Denies any other symptoms household with similar. Denies vomiting, but reports some nausea. Denies any other abnormal bleeding. Denies recent sickness, fevers, chest pain, shortness of breath, dizziness or weakness.  [facility]: PCP   Past Medical History:  Diagnosis Date  . Aneurysm of anterior cerebral artery   . Anxiety   . Ataxia   . COPD (chronic obstructive pulmonary disease) (HCC)   . Depression   . Dysarthria   . Epilepsy (HCC)   . Fibromyalgia   . GERD (gastroesophageal reflux disease)   .  Head ache   . Hypertension   . Myalgia and myositis   . Seizure (HCC)   . Stroke (HCC)   . Tobacco abuse     There are no active problems to display for this patient.   Past Surgical History:  Procedure Laterality Date  . BRAIN SURGERY      Current Outpatient Rx  . Order #: [id number] Class: Historical Med  . Order #: [id number] Class: Historical Med  . Order #: [id number] Class: Historical Med  . Order #: [id number] Class: Historical Med  . Order #: [id number] Class: Historical Med  . Order #: [id number] Class: Historical Med  . Order #: [id number] Class: Historical Med  . Order #: [id number] Class: Historical Med  . Order #: [id number] Class: Historical Med  . Order #: [id number] Class: Historical Med  . Order #: [id number] Class: Historical Med  . Order #: [id number] Class: Historical Med  . Order #: [id number] Class: Historical Med  . Order #: [id number] Class: Historical Med  . Order #: [id number] Class: Historical Med  . Order #: [id number] Class: Historical Med  . Order #: [id number] Class: Historical Med  . Order #: [id number] Class: Historical Med  . Order #: [id number] Class: Normal    No current facility-administered medications for this encounter.   Current Outpatient Prescriptions:  .  albuterol (ACCUNEB) 0.63 MG/3ML nebulizer solution, Take 1 ampule by nebulization every 6 (six) hours as needed for wheezing., Disp: , Rfl:  .  albuterol (PROAIR HFA) 108 (90 Base) MCG/ACT inhaler, Inhale into the lungs every 6 (six) hours as needed for wheezing or shortness of breath., Disp: , Rfl:  .  albuterol (PROVENTIL) (2.5 MG/3ML) 0.083% nebulizer solution, Take 2.5  mg by nebulization every 6 (six) hours as needed for wheezing or shortness of breath., Disp: , Rfl:  .  aspirin 325 MG tablet, Take 325 mg by mouth daily., Disp: , Rfl:  .  cetirizine (ZYRTEC) 10 MG tablet, Take 10 mg by mouth daily., Disp: , Rfl:  .  clonazePAM (KLONOPIN) 0.5 MG tablet, Take 0.5 mg by mouth at bedtime., Disp: , Rfl:  .   fluticasone (FLOVENT HFA) 110 MCG/ACT inhaler, Inhale into the lungs 2 (two) times daily., Disp: , Rfl:  .  furosemide (LASIX) 40 MG tablet, Take 40 mg by mouth., Disp: , Rfl:  .  hydrOXYzine (ATARAX/VISTARIL) 25 MG tablet, Take 25 mg by mouth 2 (two) times daily., Disp: , Rfl:  .  ibuprofen (ADVIL,MOTRIN) 800 MG tablet, Take 800 mg by mouth daily., Disp: , Rfl:  .  ketoconazole (NIZORAL) 2 % cream, Apply 1 application topically daily., Disp: , Rfl:  .  levETIRAcetam (KEPPRA) 750 MG tablet, Take 1,500 mg by mouth daily., Disp: , Rfl:  .  pantoprazole (PROTONIX) 40 MG tablet, Take 40 mg by mouth daily., Disp: , Rfl:  .  pregabalin (LYRICA) 100 MG capsule, Take 100 mg by mouth 2 (two) times daily., Disp: , Rfl:  .  propranolol (INDERAL) 20 MG tablet, Take 20 mg by mouth daily., Disp: , Rfl:  .  sertraline (ZOLOFT) 25 MG tablet, Take 25 mg by mouth daily., Disp: , Rfl:  .  tiZANidine (ZANAFLEX) 2 MG tablet, Take 2 mg by mouth once., Disp: , Rfl:  .  traMADol (ULTRAM) 50 MG tablet, Take 50 mg by mouth daily., Disp: , Rfl:  .  methylPREDNISolone (MEDROL DOSEPAK) 4 MG TBPK tablet, Use for 6 days as on box., Disp: 21 tablet, Rfl: 0  Allergies Chantix [varenicline] and Spiriva handihaler [tiotropium bromide monohydrate]  History reviewed. No pertinent family history.  Social History Social History  Substance Use Topics  . Smoking status: Current Every Day Smoker    Types: Cigarettes  . Smokeless tobacco: Never Used  . Alcohol use No    Review of Systems Constitutional: No fever/chills Eyes: No visual changes. ENT: No sore throat. Cardiovascular: Denies chest pain. Respiratory: Denies shortness of breath. Gastrointestinal: As above.  No constipation. Genitourinary: Negative for dysuria. Musculoskeletal: Negative for back pain. Skin: Negative for rash. Neurological: Negative for headaches, focal weakness or numbness.  10-point ROS otherwise  negative.  ____________________________________________   PHYSICAL EXAM:  VITAL SIGNS: ED Triage Vitals  Enc Vitals Group     BP 12/08/15 1343 118/73     Pulse Rate 12/08/15 1343 92     Resp 12/08/15 1343 16     Temp 12/08/15 1343 98.2 F (36.8 C)     Temp Source 12/08/15 1343 Oral     SpO2 12/08/15 1343 94 %     Weight 12/08/15 1346 185 lb (83.9 kg)     Height 12/08/15 1346 5\' 9"  (1.753 m)     Head Circumference --      Peak Flow --      Pain Score --      Pain Loc --      Pain Edu? --      Excl. in GC? --     Constitutional: Alert and oriented. Well appearing and in no acute distress. Eyes: Conjunctivae are normal. PERRL. EOMI. ENT      Head: Normocephalic and atraumatic.      Nose: No congestion/rhinnorhea.      Mouth/Throat: Mucous membranes are  moist.Oropharynx non-erythematous. Hematological/Lymphatic/Immunilogical: No cervical lymphadenopathy. Cardiovascular: Normal rate, regular rhythm. Grossly normal heart sounds.  Good peripheral circulation. Respiratory: Normal respiratory effort without tachypnea nor retractions. Breath sounds are clear and equal bilaterally. No wheezes/rales/rhonchi.. Gastrointestinal: Moderate diffuse lower abdomen tenderness. Normal Bowel sounds. No CVA tenderness. Musculoskeletal:  Ambulatory with steady gait. No midline cervical, thoracic or lumbar tenderness to palpation.  Neurologic:  Normal speech and language. No gross focal neurologic deficits are appreciated. Speech is normal. No gait instability.  Skin:  Skin is warm, dry and intact. No rash noted. Psychiatric: Mood and affect are normal. Speech and behavior are normal. Patient exhibits appropriate insight and judgment   ___________________________________________   LABS (all labs ordered are listed, but only abnormal results are displayed)  Labs Reviewed - No data to display ____________________________________________  RADIOLOGY  No results  found. ____________________________________________   PROCEDURES Procedures    INITIAL IMPRESSION / ASSESSMENT AND PLAN / ED COURSE  Pertinent labs & imaging results that were available during my care of the patient were reviewed by me and considered in my medical decision making (see chart for details).  Overall well-appearing patient. Presents for the complaints of abdominal pain and bloody diarrhea since last night. Due to abdominal tenderness and patient report of copious blood in stool recommend patient be further evaluation and likely CT and treated emergency room of her choice at this time. Recommended for patient to go directly to the emergency room. Patient alert and oriented with decisional capacity and states that her husband will drive her to the ER. Patient states that she will [facility]. Camille CMA called and report given. Patient stable at the time of discharge.   Discussed follow up with Primary care physician this week. Discussed follow up and return parameters including no resolution or any worsening concerns. Patient verbalized understanding and agreed to plan.   ____________________________________________   FINAL CLINICAL IMPRESSION(S) / ED DIAGNOSES  Final diagnoses:  Lower abdominal pain  Bloody diarrhea     Discharge Medication List as of 12/08/2015  2:34 PM      Note: This dictation was prepared with Dragon dictation along with smaller phrase technology. Any transcriptional errors that result from this process are unintentional.    Clinical Course      Renford Dills, NP 12/08/15 1722    Renford Dills, NP 12/08/15 1722

## 2015-12-08 NOTE — ED Triage Notes (Signed)
Pt here with with RLQ abd pain x2 days. Also bloody stools. Hx of diverticultis. Pt spoke with PCP last night and was advised to come here.

## 2016-08-30 ENCOUNTER — Emergency Department
Admission: EM | Admit: 2016-08-30 | Discharge: 2016-08-30 | Disposition: A | Discharge status: Home / Self Care | Payer: Medicare Other | Attending: Emergency Medicine | Admitting: Emergency Medicine

## 2016-08-30 ENCOUNTER — Encounter: Payer: Self-pay | Admitting: Emergency Medicine

## 2016-08-30 DIAGNOSIS — F17210 Nicotine dependence, cigarettes, uncomplicated: Secondary | ICD-10-CM | POA: Diagnosis not present

## 2016-08-30 DIAGNOSIS — J449 Chronic obstructive pulmonary disease, unspecified: Secondary | ICD-10-CM | POA: Diagnosis not present

## 2016-08-30 DIAGNOSIS — Z79899 Other long term (current) drug therapy: Secondary | ICD-10-CM | POA: Diagnosis not present

## 2016-08-30 DIAGNOSIS — Z7982 Long term (current) use of aspirin: Secondary | ICD-10-CM | POA: Diagnosis not present

## 2016-08-30 DIAGNOSIS — T63441A Toxic effect of venom of bees, accidental (unintentional), initial encounter: Secondary | ICD-10-CM

## 2016-08-30 DIAGNOSIS — Z7951 Long term (current) use of inhaled steroids: Secondary | ICD-10-CM | POA: Insufficient documentation

## 2016-08-30 DIAGNOSIS — T7840XA Allergy, unspecified, initial encounter: Secondary | ICD-10-CM

## 2016-08-30 DIAGNOSIS — Z8673 Personal history of transient ischemic attack (TIA), and cerebral infarction without residual deficits: Secondary | ICD-10-CM | POA: Insufficient documentation

## 2016-08-30 DIAGNOSIS — R0602 Shortness of breath: Secondary | ICD-10-CM | POA: Diagnosis present

## 2016-08-30 DIAGNOSIS — I10 Essential (primary) hypertension: Secondary | ICD-10-CM | POA: Diagnosis not present

## 2016-08-30 MED ORDER — EPINEPHRINE 0.3 MG/0.3ML IJ SOAJ: 0.3000 mg | Freq: Once | INTRAMUSCULAR | 1 refills | Status: AC

## 2016-08-30 MED ORDER — FAMOTIDINE IN NACL 20-0.9 MG/50ML-% IV SOLN
20.0000 mg | Freq: Once | INTRAVENOUS | Status: AC
  Administered 2016-08-30: 20 mg via INTRAVENOUS
  Filled 2016-08-30: qty 50

## 2016-08-30 MED ORDER — METHYLPREDNISOLONE SODIUM SUCC 125 MG IJ SOLR
125.0000 mg | Freq: Once | INTRAMUSCULAR | Status: AC
  Administered 2016-08-30: 125 mg via INTRAVENOUS
  Filled 2016-08-30: qty 2

## 2016-08-30 MED ORDER — PREDNISONE 20 MG PO TABS: 60.0000 mg | ORAL_TABLET | Freq: Every day | ORAL | 0 refills | Status: AC

## 2016-08-30 MED ORDER — ALBUTEROL SULFATE (2.5 MG/3ML) 0.083% IN NEBU
5.0000 mg | INHALATION_SOLUTION | Freq: Once | RESPIRATORY_TRACT | Status: AC
  Administered 2016-08-30: 5 mg via RESPIRATORY_TRACT
  Filled 2016-08-30: qty 6

## 2016-08-30 NOTE — ED Triage Notes (Signed)
Patient presents to the ED via EMS from home after being stung by bees in her upper back and complaint of abdominal pain.  Per EMS family gave patient 50mg  benadryl prior to calling EMS.  Patient is having no obvious difficulty breathing.  Patient has history of diverticulitis and is complaining of abdominal pain that feels similar.  Patient is very difficult to understand as she has slurred speech, EMS is unsure if this is baseline or not.  Patient immediately stated she wanted to go to the bathroom on arrival to the ED.  Patient sitting on toilet for approx. 8 min. Trying to have a bowel movement.

## 2016-08-30 NOTE — ED Provider Notes (Signed)
[facility] Emergency Department Provider Note  ____________________________________________  Time seen: Approximately 8:04 AM  I have reviewed the triage vital signs and the nursing notes.   HISTORY  Chief Complaint Insect Bite   HPI Anna Ayala is a 41 y.o. female who presents for evaluation of an insect bite. Patient has a history of anaphylaxis to bee stings. She was walking outside her house today when she got stung twice by a bee. She started having shortness of breath which prompted EMS to be called. She has a history of COPD. Upon EMS arrival patient was noted to be wheezing and received 2.5 mg of albuterol. She reports that her shortness of breath is improved but is still persistent. She denies tongue swelling, angioedema, difficulty swallowing, vomiting or diarrhea. No hives. She reports her SOB is currently mild and constant since being stung.  Past Medical History:  Diagnosis Date  . Aneurysm of anterior cerebral artery   . Anxiety   . Ataxia   . COPD (chronic obstructive pulmonary disease) (HCC)   . Depression   . Dysarthria   . Epilepsy (HCC)   . Fibromyalgia   . GERD (gastroesophageal reflux disease)   . Head ache   . Hypertension   . Myalgia and myositis   . Seizure (HCC)   . Stroke (HCC)   . Tobacco abuse     There are no active problems to display for this patient.   Past Surgical History:  Procedure Laterality Date  . BRAIN SURGERY      Prior to Admission medications   Medication Sig Start Date End Date Taking? Authorizing Provider  albuterol (ACCUNEB) 0.63 MG/3ML nebulizer solution Take 1 ampule by nebulization every 6 (six) hours as needed for wheezing.    [provider]  albuterol (PROAIR HFA) 108 (90 Base) MCG/ACT inhaler Inhale into the lungs every 6 (six) hours as needed for wheezing or shortness of breath.    [provider]  albuterol (PROVENTIL) (2.5 MG/3ML) 0.083% nebulizer solution Take 2.5  mg by nebulization every 6 (six) hours as needed for wheezing or shortness of breath.    [provider]  aspirin 325 MG tablet Take 325 mg by mouth daily.    [provider]  cetirizine (ZYRTEC) 10 MG tablet Take 10 mg by mouth daily.    [provider]  clonazePAM (KLONOPIN) 0.5 MG tablet Take 0.5 mg by mouth at bedtime.    [provider]  EPINEPHrine 0.3 mg/0.3 mL IJ SOAJ injection Inject 0.3 mLs (0.3 mg total) into the muscle once. 08/30/16 08/30/16  Sue Sickle, MD  fluticasone (FLOVENT HFA) 110 MCG/ACT inhaler Inhale into the lungs 2 (two) times daily.    [provider]  furosemide (LASIX) 40 MG tablet Take 40 mg by mouth.    [provider]  hydrOXYzine (ATARAX/VISTARIL) 25 MG tablet Take 25 mg by mouth 2 (two) times daily.    [provider]  ibuprofen (ADVIL,MOTRIN) 800 MG tablet Take 800 mg by mouth daily.    [provider]  ketoconazole (NIZORAL) 2 % cream Apply 1 application topically daily.    [provider]  levETIRAcetam (KEPPRA) 750 MG tablet Take 1,500 mg by mouth daily.    [provider]  methylPREDNISolone (MEDROL DOSEPAK) 4 MG TBPK tablet Use for 6 days as on box. 10/12/15   Jolene Provost, MD  pantoprazole (PROTONIX) 40 MG tablet Take 40 mg by mouth daily.    [provider]  predniSONE (DELTASONE) 20 MG tablet Take 3 tablets (60 mg total) by mouth daily. 08/30/16 09/03/16  Florentina SickleVeronese, [place], MD  pregabalin (LYRICA) 100 MG capsule Take 100 mg by mouth 2 (two) times daily.    [provider]  propranolol (INDERAL) 20 MG tablet Take 20 mg by mouth daily.    [provider]  sertraline (ZOLOFT) 25 MG tablet Take 25 mg by mouth daily.    [provider]  tiZANidine (ZANAFLEX) 2 MG tablet Take 2 mg by mouth once.    [provider]  traMADol (ULTRAM) 50 MG tablet Take 50 mg by mouth daily.    [provider]    Allergies Chantix  [varenicline] and Spiriva handihaler [tiotropium bromide monohydrate]  No family history on file.  Social History Social History  Substance Use Topics  . Smoking status: Current Every Day Smoker    Types: Cigarettes  . Smokeless tobacco: Never Used  . Alcohol use No    Review of Systems  Constitutional: Negative for fever. Eyes: Negative for visual changes. ENT: Negative for sore throat. Neck: No neck pain  Cardiovascular: Negative for chest pain. Respiratory: +shortness of breath. Gastrointestinal: Negative for abdominal pain, vomiting or diarrhea. Genitourinary: Negative for dysuria. Musculoskeletal: Negative for back pain. Skin: Negative for rash. + bee sting Neurological: Negative for headaches, weakness or numbness. Psych: No SI or HI  ____________________________________________   PHYSICAL EXAM:  VITAL SIGNS: ED Triage Vitals  Enc Vitals Group     BP --      Pulse --      Resp 08/30/16 0754 16     Temp 08/30/16 0754 (!) 97.3 F (36.3 C)     Temp Source 08/30/16 0754 Oral     SpO2 08/30/16 0754 92 %     Weight 08/30/16 0739 214 lb (97.1 kg)     Height 08/30/16 0739 5\' 9"  (1.753 m)     Head Circumference --      Peak Flow --      Pain Score 08/30/16 0738 7     Pain Loc --      Pain Edu? --      Excl. in GC? --     Constitutional: Alert and oriented. Well appearing and in no apparent distress. HEENT:      Head: Normocephalic and atraumatic.         Eyes: Conjunctivae are normal. Sclera is non-icteric.       Mouth/Throat: Mucous membranes are moist.       Neck: Supple with no signs of meningismus. Cardiovascular: Regular rate and rhythm. No murmurs, gallops, or rubs. 2+ symmetrical distal pulses are present in all extremities. No JVD. Respiratory: Normal respiratory effort. Lungs are clear to auscultation bilaterally with slight decrease air movement on the R. No wheezes, crackles, or rhonchi.  Gastrointestinal: Soft, non tender, and non distended with  positive bowel sounds. No rebound or guarding. Musculoskeletal: Nontender with normal range of motion in all extremities. No edema, cyanosis, or erythema of extremities. Neurologic: Normal speech and language. Face is symmetric. Moving all extremities. No gross focal neurologic deficits are appreciated. Skin: Skin is warm, dry and intact. No rash noted. There are two sting marks in her back with small amount of surrounding erythema and wheal Psychiatric: Mood and affect are normal. Speech and behavior are normal.  ____________________________________________   LABS (all labs ordered are listed, but only abnormal results are displayed)  Labs Reviewed - No data to display ____________________________________________  EKG  none  ____________________________________________  RADIOLOGY  none  ____________________________________________   PROCEDURES  Procedure(s) performed: None Procedures Critical Care performed:  None ____________________________________________   INITIAL IMPRESSION / ASSESSMENT AND PLAN / ED COURSE  41 y.o. female who presents for evaluation of an insect bite and SOB. Patient with no evidence of anaphylaxis. Has mild SOB with slightly decreased air movement on the right. We'll give albuterol since patient is allergic to ipratropium, Solu-Medrol, Pepcid, and monitor patient closely for any signs of anaphylaxis.  Clinical Course as of Aug 30 1548  Wynelle Link Aug 30, 2016  1000 Patient feels markedly improved. Ambulating without hypoxia. She's been monitoring the emergency room for 2.5 hours with no recurrence of her symptoms. Patients can be discharged home with an EpiPen and close follow-up with primary care doctor.  [CV]    Clinical Course User Index [CV] Karrisa Sickle, MD    Pertinent labs & imaging results that were available during my care of the patient were reviewed by me and considered in my medical decision making (see chart for  details).    ____________________________________________   FINAL CLINICAL IMPRESSION(S) / ED DIAGNOSES  Final diagnoses:  Bee sting, accidental or unintentional, initial encounter  Allergic reaction, initial encounter      NEW MEDICATIONS STARTED DURING THIS VISIT:  Discharge Medication List as of 08/30/2016 10:03 AM    START taking these medications   Details  EPINEPHrine 0.3 mg/0.3 mL IJ SOAJ injection Inject 0.3 mLs (0.3 mg total) into the muscle once., Starting Sun 08/30/2016, Print    predniSONE (DELTASONE) 20 MG tablet Take 3 tablets (60 mg total) by mouth daily., Starting Sun 08/30/2016, Until Thu 09/03/2016, Print         Note:  This document was prepared using Dragon voice recognition software and may include unintentional dictation errors.    Don Perking, [place], MD 08/30/16 [phone]

## 2016-12-13 ENCOUNTER — Encounter: Payer: Self-pay | Admitting: *Deleted

## 2016-12-13 ENCOUNTER — Ambulatory Visit
Admission: EM | Admit: 2016-12-13 | Discharge: 2016-12-13 | Disposition: A | Discharge status: Home / Self Care | Payer: Medicare Other | Attending: Family Medicine | Admitting: Family Medicine

## 2016-12-13 DIAGNOSIS — R1031 Right lower quadrant pain: Secondary | ICD-10-CM | POA: Diagnosis not present

## 2016-12-13 MED ORDER — KETOROLAC TROMETHAMINE 10 MG PO TABS: 10.0000 mg | ORAL_TABLET | Freq: Four times a day (QID) | ORAL | 0 refills | Status: DC | PRN

## 2016-12-13 MED ORDER — KETOROLAC TROMETHAMINE 60 MG/2ML IM SOLN
60.0000 mg | Freq: Once | INTRAMUSCULAR | Status: AC
  Administered 2016-12-13: 60 mg via INTRAMUSCULAR

## 2016-12-13 NOTE — ED Triage Notes (Signed)
PAtient was diagnosed with a right inguinal hernia 2 weeks ago. Patient is still having pain issues.

## 2016-12-13 NOTE — Discharge Instructions (Signed)
Use the Toradol as directed. No Ibuprofen.  Follow up with your primary.  Take care  Dr. Adriana Simasook

## 2016-12-13 NOTE — ED Provider Notes (Signed)
[facility]    CSN: [id number] Arrival date & time: 12/13/16  0810  History   Chief Complaint Chief Complaint  Patient presents with  . Inguinal Hernia   HPI  41 year old female presents with right inguinal pain.  Patient states that she recently saw her primary care physician and was told that she likely had a inguinal hernia. She's had this pain for the past few weeks. She's been taking ibuprofen without improvement. Also been icing the area. Worse with movement and activity. Also worse with sitting. She reports stabbing pain. No reported nausea or vomiting. No other associated symptoms. No other complaints at this time.  Past Medical History:  Diagnosis Date  . Aneurysm of anterior cerebral artery   . Anxiety   . Ataxia   . COPD (chronic obstructive pulmonary disease) (HCC)   . Depression   . Dysarthria   . Epilepsy (HCC)   . Fibromyalgia   . GERD (gastroesophageal reflux disease)   . Head ache   . Hypertension   . Myalgia and myositis   . Seizure (HCC)   . Stroke (HCC)   . Tobacco abuse    Past Surgical History:  Procedure Laterality Date  . BRAIN SURGERY     OB History    No data available     Home Medications    Prior to Admission medications   Medication Sig Start Date End Date Taking? Authorizing Provider  albuterol (PROAIR HFA) 108 (90 Base) MCG/ACT inhaler Inhale into the lungs every 6 (six) hours as needed for wheezing or shortness of breath.   Yes [provider]  albuterol (PROVENTIL) (2.5 MG/3ML) 0.083% nebulizer solution Take 2.5 mg by nebulization every 6 (six) hours as needed for wheezing or shortness of breath.   Yes [provider]  aspirin 325 MG tablet Take 325 mg by mouth daily.   Yes [provider]  cetirizine (ZYRTEC) 10 MG tablet Take 10 mg by mouth daily.   Yes [provider]  clonazePAM (KLONOPIN) 0.5 MG tablet Take 0.5 mg by mouth at bedtime.   Yes [provider]  fluticasone  (FLOVENT HFA) 110 MCG/ACT inhaler Inhale into the lungs 2 (two) times daily.   Yes [provider]  furosemide (LASIX) 40 MG tablet Take 40 mg by mouth.   Yes [provider]  hydrOXYzine (ATARAX/VISTARIL) 25 MG tablet Take 25 mg by mouth 2 (two) times daily.   Yes [provider]  ketoconazole (NIZORAL) 2 % cream Apply 1 application topically daily.   Yes [provider]  levETIRAcetam (KEPPRA) 750 MG tablet Take 1,500 mg by mouth daily.   Yes [provider]  pantoprazole (PROTONIX) 40 MG tablet Take 40 mg by mouth daily.   Yes [provider]  pregabalin (LYRICA) 100 MG capsule Take 100 mg by mouth 2 (two) times daily.   Yes [provider]  propranolol (INDERAL) 20 MG tablet Take 20 mg by mouth daily.   Yes [provider]  sertraline (ZOLOFT) 25 MG tablet Take 25 mg by mouth daily.   Yes [provider]  tiZANidine (ZANAFLEX) 2 MG tablet Take 2 mg by mouth once.   Yes [provider]  ketorolac (TORADOL) 10 MG tablet Take 1 tablet (10 mg total) by mouth every 6 (six) hours as needed. 12/13/16   Tommie Samsook, Deagen Krass G, DO    Family History History reviewed. No pertinent family history.  Social History Social History  Substance Use Topics  . Smoking  status: Current Every Day Smoker    Types: Cigarettes  . Smokeless tobacco: Never Used  . Alcohol use No   Allergies   Bee venom; Chantix [varenicline]; Spiriva handihaler [tiotropium bromide monohydrate]; Tramadol; and Diclofenac  Review of Systems Review of Systems  Constitutional: Negative.   Gastrointestinal: Negative for nausea and vomiting.       Right inguinal pain.   Physical Exam Triage Vital Signs ED Triage Vitals  Enc Vitals Group     BP 12/13/16 0830 137/86     Pulse Rate 12/13/16 0830 79     Resp 12/13/16 0830 16     Temp 12/13/16 0830 98.6 F (37 C)     Temp Source 12/13/16 0830 Oral     SpO2 12/13/16 0830 97 %     Weight 12/13/16  0831 218 lb (98.9 kg)     Height --      Head Circumference --      Peak Flow --      Pain Score 12/13/16 0833 8     Pain Loc --      Pain Edu? --      Excl. in GC? --    Updated Vital Signs BP 137/86 (BP Location: Left Arm)   Pulse 79   Temp 98.6 F (37 C) (Oral)   Resp 16   Wt 218 lb (98.9 kg)   LMP 09/16/2016   SpO2 97%   BMI 32.19 kg/m   Physical Exam  Constitutional: She is oriented to person, place, and time. She appears well-developed. No distress.  HENT:  Head: Normocephalic and atraumatic.  Cardiovascular: Normal rate and regular rhythm.   No murmur heard. Pulmonary/Chest: Effort normal and breath sounds normal. She has no wheezes. She has no rales.  Abdominal: Soft.  Mark a tenderness in the right inguinal region. I cannot appreciate a hernia.  Neurological: She is alert and oriented to person, place, and time.  Dysarthric.  Psychiatric:  Flat affect.  Vitals reviewed.  UC Treatments / Results  Labs (all labs ordered are listed, but only abnormal results are displayed) Labs Reviewed - No data to display  EKG  EKG Interpretation None       Radiology No results found.  Procedures Procedures (including critical care time)  Medications Ordered in UC Medications  ketorolac (TORADOL) injection 60 mg (60 mg Intramuscular Given 12/13/16 0912)     Initial Impression / Assessment and Plan / UC Course  I have reviewed the triage vital signs and the nursing notes.  Pertinent labs & imaging results that were available during my care of the patient were reviewed by me and considered in my medical decision making (see chart for details).    41 year old female presents with right ankle pain. Possible hernia. Treating with Toradol. Follow-up with PCP.  Final Clinical Impressions(s) / UC Diagnoses   Final diagnoses:  Right inguinal pain    New Prescriptions New Prescriptions   KETOROLAC (TORADOL) 10 MG TABLET    Take 1 tablet (10 mg total) by mouth  every 6 (six) hours as needed.   Controlled Substance Prescriptions [place] Controlled Substance Registry consulted? Not Applicable   Tommie Sams, [place] 12/13/16 5409

## 2018-06-06 ENCOUNTER — Ambulatory Visit: Payer: Medicare Other

## 2018-06-06 ENCOUNTER — Ambulatory Visit
Admission: EM | Admit: 2018-06-06 | Discharge: 2018-06-06 | Disposition: A | Discharge status: Home / Self Care | Payer: Medicare Other | Attending: Family Medicine | Admitting: Family Medicine

## 2018-06-06 ENCOUNTER — Other Ambulatory Visit: Payer: Self-pay

## 2018-06-06 DIAGNOSIS — Z8669 Personal history of other diseases of the nervous system and sense organs: Secondary | ICD-10-CM

## 2018-06-06 DIAGNOSIS — M25421 Effusion, right elbow: Secondary | ICD-10-CM | POA: Diagnosis not present

## 2018-06-06 MED ORDER — MELOXICAM 15 MG PO TABS: 15.0000 mg | ORAL_TABLET | Freq: Every day | ORAL | 0 refills | Status: DC | PRN

## 2018-06-06 NOTE — ED Provider Notes (Signed)
[facility]    CSN: [id number] Arrival date & time: 06/06/18  1007  History   Chief Complaint Chief Complaint  Patient presents with  . Seizures   HPI  43 year old female presents with right arm pain.  Patient is followed by neurology for seizures.  Patient states that her husband informed her that she had a seizure on Saturday.  She states that since that time she has been experiencing right arm pain.  She reports swelling in her right hand.  She reports right elbow pain and swelling.  Decreased range of motion.  Patient is concerned that she may have suffered a fracture.  She has taken ibuprofen without resolution.  Exacerbated by activity.  No relieving factors.  No other associated symptoms.  No other complaints.   PMH, Surgical Hx, Family Hx, Social History reviewed and updated as below.  Past Medical History:  Diagnosis Date  . Aneurysm of anterior cerebral artery   . Anxiety   . Ataxia   . COPD (chronic obstructive pulmonary disease) (HCC)   . Depression   . Dysarthria   . Fibromyalgia   . GERD (gastroesophageal reflux disease)   . Head ache   . Hypertension   . Myalgia and myositis   . Seizure (HCC)   . Stroke (HCC)   . Tobacco abuse    Past Surgical History:  Procedure Laterality Date  . ABDOMINAL HYSTERECTOMY    . BRAIN SURGERY     OB History   No obstetric history on file.    Home Medications    Prior to Admission medications   Medication Sig Start Date End Date Taking? Authorizing Provider  albuterol (PROAIR HFA) 108 (90 Base) MCG/ACT inhaler Inhale into the lungs every 6 (six) hours as needed for wheezing or shortness of breath.   Yes [provider]  albuterol (PROVENTIL) (2.5 MG/3ML) 0.083% nebulizer solution Take 2.5 mg by nebulization every 6 (six) hours as needed for wheezing or shortness of breath.   Yes [provider]  aspirin 325 MG tablet Take 325 mg by mouth daily.   Yes [provider]  cetirizine  (ZYRTEC) 10 MG tablet Take 10 mg by mouth daily.   Yes [provider]  clonazePAM (KLONOPIN) 0.5 MG tablet Take 0.5 mg by mouth at bedtime.   Yes [provider]  fluticasone (FLOVENT HFA) 110 MCG/ACT inhaler Inhale into the lungs 2 (two) times daily.   Yes [provider]  furosemide (LASIX) 40 MG tablet Take 40 mg by mouth.   Yes [provider]  hydrOXYzine (ATARAX/VISTARIL) 25 MG tablet Take 25 mg by mouth 2 (two) times daily.   Yes [provider]  ketoconazole (NIZORAL) 2 % cream Apply 1 application topically daily.   Yes [provider]  levETIRAcetam (KEPPRA) 750 MG tablet Take 1,500 mg by mouth daily.   Yes [provider]  pantoprazole (PROTONIX) 40 MG tablet Take 40 mg by mouth daily.   Yes [provider]  pregabalin (LYRICA) 100 MG capsule Take 100 mg by mouth 2 (two) times daily.   Yes [provider]  propranolol (INDERAL) 20 MG tablet Take 20 mg by mouth daily.   Yes [provider]  sertraline (ZOLOFT) 25 MG tablet Take 25 mg by mouth daily.   Yes [provider]  tiZANidine (ZANAFLEX) 2 MG tablet Take 2 mg by mouth once.   Yes [provider]  meloxicam (MOBIC) 15 MG tablet Take 1 tablet (15 mg total)  by mouth daily as needed for pain. 06/06/18   Tommie Sams, DO   Family History Family History  Adopted: Yes   Social History Social History   Tobacco Use  . Smoking status: Current Every Day Smoker    Types: Cigarettes  . Smokeless tobacco: Never Used  Substance Use Topics  . Alcohol use: No  . Drug use: Not on file   Allergies   Bee venom; Chantix [varenicline]; Spiriva handihaler [tiotropium bromide monohydrate]; Tramadol; and Diclofenac   Review of Systems Review of Systems  Musculoskeletal:       Right elbow pain, Right hand swelling.  Neurological: Positive for seizures.   Physical Exam Triage Vital Signs ED Triage Vitals  Enc Vitals Group     BP  06/06/18 1017 (!) 145/98     Pulse Rate 06/06/18 1017 86     Resp 06/06/18 1017 18     Temp 06/06/18 1017 98.1 F (36.7 C)     Temp Source 06/06/18 1017 Oral     SpO2 06/06/18 1017 93 %     Weight 06/06/18 1019 190 lb (86.2 kg)     Height 06/06/18 1019  (1.753 m)     Head Circumference --      Peak Flow --      Pain Score 06/06/18 1019 8     Pain Loc --      Pain Edu? --      Excl. in GC? --    Updated Vital Signs BP (!) 145/98 (BP Location: Left Arm)   Pulse 86   Temp 98.1 F (36.7 C) (Oral)   Resp 18   Ht  (1.753 m)   Wt 86.2 kg   LMP 09/16/2016   SpO2 93%   BMI 28.06 kg/m   Visual Acuity Right Eye Distance:   Left Eye Distance:   Bilateral Distance:    Right Eye Near:   Left Eye Near:    Bilateral Near:     Physical Exam Vitals signs and nursing note reviewed.  Constitutional:      General: She is not in acute distress.    Appearance: Normal appearance.  HENT:     Head: Normocephalic and atraumatic.  Eyes:     General:        Right eye: No discharge.        Left eye: No discharge.     Conjunctiva/sclera: Conjunctivae normal.  Cardiovascular:     Rate and Rhythm: Normal rate and regular rhythm.  Pulmonary:     Effort: Pulmonary effort is normal.     Breath sounds: Normal breath sounds. No wheezing or rales.  Musculoskeletal:     Comments: Right hand -no appreciable swelling.  Nontender to palpation.  Right elbow -tender to palpation over the lateral epicondyle.  Decreased range of motion, patient unable to fully extend.  Also has decreased range of motion in supination and pronation.  Skin:    General: Skin is warm.     Findings: No bruising.  Neurological:     Mental Status: She is alert.  Psychiatric:     Comments: Flat affect. Avoids eye contact.    UC Treatments / Results  Labs (all labs ordered are listed, but only abnormal results are displayed) Labs Reviewed - No data to display  EKG None  Radiology Dg Elbow Complete  Right  Result Date: 06/06/2018 CLINICAL DATA:  Recent seizure now with right elbow pain. EXAM: RIGHT ELBOW - COMPLETE 3+ VIEW COMPARISON:  None. FINDINGS: Note is made of a small elbow joint effusion however it a definitive acute displaced fracture is not identified with special attention paid to the radial head. Enthesopathic change noted about the medial and lateral epicondyles, likely the sequela of remote avulsive injury. There is a minimal amount of soft tissue swelling about the elbow. No radiopaque foreign body. IMPRESSION: Small elbow joint effusion suggestive of an intra-articular fracture, though a definitive displaced fracture is not identified with special attention paid to the radial head. Prophylactic splinting and follow-up radiographs could be performed in 7-10 days as indicated. Electronically Signed   By: Simonne Come M.D.   On: 06/06/2018 10:55    Procedures Procedures (including critical care time)  Medications Ordered in UC Medications - No data to display  Initial Impression / Assessment and Plan / UC Course  I have reviewed the triage vital signs and the nursing notes.  Pertinent labs & imaging results that were available during my care of the patient were reviewed by me and considered in my medical decision making (see chart for details).    43 year old female presents with right elbow pain.  X-ray with effusion.  Concern for fracture although no definitive fracture identified.  Placed in splint.  Meloxicam for pain and swelling.  Advised to see [facility].  Final Clinical Impressions(s) / UC Diagnoses   Final diagnoses:  Effusion of right elbow     Discharge Instructions     Rest.  Ice.  Wear sling.  Medication as prescribed.  See [facility] for follow up.  Take care  Dr. Adriana Simas     ED Prescriptions    Medication Sig Dispense Auth. Provider   meloxicam (MOBIC) 15 MG tablet Take 1 tablet (15 mg total) by mouth daily as needed for pain. 14 tablet  Tommie Sams, DO     Controlled Substance Prescriptions [place] Controlled Substance Registry consulted? Not Applicable   Tommie Sams, DO 06/06/18 1120

## 2018-06-06 NOTE — Discharge Instructions (Signed)
Rest.  Ice.  Wear sling.  Medication as prescribed.  See [facility] for follow up.  Take care  Dr. Adriana Simas

## 2018-06-06 NOTE — ED Triage Notes (Signed)
Pt states she had a seizure on Saturday and doesn't remember what happened. But her husband told her she had one and since then having right arm pain and swelling in her right hand. Can move it a small amount and has been taking some ibuprofen. Thinks the bone "has shifted" No pain reported in her right shoulder.

## 2020-01-08 ENCOUNTER — Encounter: Payer: Self-pay | Admitting: Emergency Medicine

## 2020-01-08 ENCOUNTER — Other Ambulatory Visit: Payer: Self-pay

## 2020-01-08 ENCOUNTER — Ambulatory Visit
Admission: EM | Admit: 2020-01-08 | Discharge: 2020-01-08 | Disposition: A | Discharge status: Home / Self Care | Payer: Medicare Other | Attending: Family Medicine | Admitting: Family Medicine

## 2020-01-08 ENCOUNTER — Ambulatory Visit (INDEPENDENT_AMBULATORY_CARE_PROVIDER_SITE_OTHER): Payer: Medicare Other

## 2020-01-08 DIAGNOSIS — R059 Cough, unspecified: Secondary | ICD-10-CM

## 2020-01-08 DIAGNOSIS — R509 Fever, unspecified: Secondary | ICD-10-CM | POA: Diagnosis not present

## 2020-01-08 DIAGNOSIS — J441 Chronic obstructive pulmonary disease with (acute) exacerbation: Secondary | ICD-10-CM | POA: Diagnosis not present

## 2020-01-08 MED ORDER — AZITHROMYCIN 250 MG PO TABS: ORAL_TABLET | ORAL | 0 refills | Status: DC

## 2020-01-08 MED ORDER — PREDNISONE 50 MG PO TABS: ORAL_TABLET | ORAL | 0 refills | Status: DC

## 2020-01-08 NOTE — ED Triage Notes (Addendum)
Patient c/o nasal congestion and cough that started 8 days ago. Patient refused COVID testing.

## 2020-01-08 NOTE — Discharge Instructions (Signed)
No evidence of pneumonia.  Medications as prescribed.  Take care  Dr. Adriana Simas

## 2020-01-08 NOTE — ED Provider Notes (Signed)
[facility]    CSN: [id number] Arrival date & time: 01/08/20  0908      History   Chief Complaint Chief Complaint  Patient presents with  . Nasal Congestion  . Cough   HPI  44 year old female presents with the above complaints.  Patient reports that her symptoms started last Saturday.  She reports cough and congestion.  Patient also reports subjective fever.  She has not taken her temperature.  She states that her symptoms started after she was in a hospital room with her son.  She states that she started having runny nose and cough.  She believes that she was allergic to something.  Symptoms have persisted.  She has had no relief with her regular medication.  No other reported symptoms.  No other complaints.  Patient declines Covid testing today.  Past Medical History:  Diagnosis Date  . Aneurysm of anterior cerebral artery   . Anxiety   . Ataxia   . COPD (chronic obstructive pulmonary disease) (HCC)   . Depression   . Dysarthria   . Fibromyalgia   . GERD (gastroesophageal reflux disease)   . Head ache   . Hypertension   . Myalgia and myositis   . Seizure (HCC)   . Stroke (HCC)   . Tobacco abuse    Past Surgical History:  Procedure Laterality Date  . ABDOMINAL HYSTERECTOMY    . BRAIN SURGERY      OB History   No obstetric history on file.      Home Medications    Prior to Admission medications   Medication Sig Start Date End Date Taking? Authorizing Provider  albuterol (PROAIR HFA) 108 (90 Base) MCG/ACT inhaler Inhale into the lungs every 6 (six) hours as needed for wheezing or shortness of breath.   Yes [provider]  albuterol (PROVENTIL) (2.5 MG/3ML) 0.083% nebulizer solution Take 2.5 mg by nebulization every 6 (six) hours as needed for wheezing or shortness of breath.   Yes [provider]  aspirin 325 MG tablet Take 325 mg by mouth daily.   Yes [provider]  cetirizine (ZYRTEC) 10 MG tablet Take 10 mg by  mouth daily.   Yes [provider]  clonazePAM (KLONOPIN) 0.5 MG tablet Take 0.5 mg by mouth at bedtime.   Yes [provider]  fluticasone (FLOVENT HFA) 110 MCG/ACT inhaler Inhale into the lungs 2 (two) times daily.   Yes [provider]  furosemide (LASIX) 40 MG tablet Take 40 mg by mouth.   Yes [provider]  hydrOXYzine (ATARAX/VISTARIL) 25 MG tablet Take 25 mg by mouth 2 (two) times daily.   Yes [provider]  ketoconazole (NIZORAL) 2 % cream Apply 1 application topically daily.   Yes [provider]  levETIRAcetam (KEPPRA) 750 MG tablet Take 1,500 mg by mouth daily.   Yes [provider]  meloxicam (MOBIC) 15 MG tablet Take 1 tablet (15 mg total) by mouth daily as needed for pain. 06/06/18  Yes Babetta Paterson G, DO  pregabalin (LYRICA) 100 MG capsule Take 100 mg by mouth 2 (two) times daily.   Yes [provider]  propranolol (INDERAL) 20 MG tablet Take 20 mg by mouth daily.   Yes [provider]  sertraline (ZOLOFT) 25 MG tablet Take 25 mg by mouth daily.   Yes [provider]  tiZANidine (ZANAFLEX) 2 MG tablet Take 2 mg by mouth once.   Yes [provider]  azithromycin (ZITHROMAX) 250 MG tablet  2 tablets on day 1, then 1 tablet daily on days 2-5. 01/08/20   Tommie Sams, DO  pantoprazole (PROTONIX) 40 MG tablet Take 40 mg by mouth daily.    [provider]  predniSONE (DELTASONE) 50 MG tablet 1 tablet daily x 5 days 01/08/20   Tommie Sams, DO    Family History Family History  Adopted: Yes    Social History Social History   Tobacco Use  . Smoking status: Current Every Day Smoker    Types: Cigarettes  . Smokeless tobacco: Never Used  Vaping Use  . Vaping Use: Never used  Substance Use Topics  . Alcohol use: No  . Drug use: Not on file     Allergies   Bee venom, Chantix [varenicline], Spiriva handihaler [tiotropium bromide monohydrate], Tramadol, and  Diclofenac   Review of Systems Review of Systems  Constitutional: Positive for fever.  HENT: Positive for congestion.   Respiratory: Positive for cough.    Physical Exam Triage Vital Signs ED Triage Vitals  Enc Vitals Group     BP 01/08/20 0932 105/79     Pulse Rate 01/08/20 0932 72     Resp 01/08/20 0932 18     Temp 01/08/20 0932 98.2 F (36.8 C)     Temp Source 01/08/20 0932 Oral     SpO2 01/08/20 0932 92 %     Weight 01/08/20 0928 190 lb 0.6 oz (86.2 kg)     Height 01/08/20 0928 5\' 9"  (1.753 m)     Head Circumference --      Peak Flow --      Pain Score 01/08/20 0928 5     Pain Loc --      Pain Edu? --      Excl. in GC? --    Updated Vital Signs BP 105/79 (BP Location: Left Arm)   Pulse 72   Temp 98.2 F (36.8 C) (Oral)   Resp 18   Ht 5\' 9"  (1.753 m)   Wt 86.2 kg   LMP 09/16/2016   SpO2 92%   BMI 28.06 kg/m   Visual Acuity Right Eye Distance:   Left Eye Distance:   Bilateral Distance:    Right Eye Near:   Left Eye Near:    Bilateral Near:     Physical Exam Vitals and nursing note reviewed.  Constitutional:      General: She is not in acute distress.    Appearance: Normal appearance. She is not ill-appearing.  HENT:     Head: Normocephalic and atraumatic.  Eyes:     General:        Right eye: No discharge.        Left eye: No discharge.     Conjunctiva/sclera: Conjunctivae normal.  Cardiovascular:     Rate and Rhythm: Normal rate and regular rhythm.  Pulmonary:     Effort: Pulmonary effort is normal.     Breath sounds: Rales present.  Neurological:     Mental Status: She is alert.  Psychiatric:        Mood and Affect: Mood normal.        Behavior: Behavior normal.    UC Treatments / Results  Labs (all labs ordered are listed, but only abnormal results are displayed) Labs Reviewed - No data to display  EKG   Radiology DG Chest 2 View  Result Date: 01/08/2020 CLINICAL DATA:  Cough and fever EXAM: CHEST - 2 VIEW COMPARISON:  October 12, 2015 FINDINGS: There  is atelectatic change in the lingula. The lungs elsewhere are clear. Heart size and pulmonary vascularity are normal. No adenopathy. There is thoracolumbar levoscoliosis. IMPRESSION: Lingular atelectasis. Lungs elsewhere clear. Cardiac silhouette within normal limits. Electronically Signed   By: Bretta Bang III M.D.   On: 01/08/2020 10:01    Procedures Procedures (including critical care time)  Medications Ordered in UC Medications - No data to display  Initial Impression / Assessment and Plan / UC Course  I have reviewed the triage vital signs and the nursing notes.  Pertinent labs & imaging results that were available during my care of the patient were reviewed by me and considered in my medical decision making (see chart for details).    44 year old female presents with COPD exacerbation.  Chest x-ray was obtained and revealed atelectasis but no evidence of infiltrate.  Treating with prednisone and azithromycin.  Advised to continue her home inhalers.  Supportive care.   Final Clinical Impressions(s) / UC Diagnoses   Final diagnoses:  COPD exacerbation (HCC)     Discharge Instructions     No evidence of pneumonia.  Medications as prescribed.  Take care  Dr. Adriana Simas    ED Prescriptions    Medication Sig Dispense Auth. Provider   predniSONE (DELTASONE) 50 MG tablet 1 tablet daily x 5 days 5 tablet Remedios Mckone G, DO   azithromycin (ZITHROMAX) 250 MG tablet 2 tablets on day 1, then 1 tablet daily on days 2-5. 6 tablet Tommie Sams, DO     PDMP not reviewed this encounter.   Tommie Sams, [place] 01/08/20 1014

## 2020-03-14 ENCOUNTER — Ambulatory Visit: Payer: Medicare Other | Admitting: Physical Therapy

## 2020-03-18 ENCOUNTER — Ambulatory Visit: Payer: Medicare Other | Admitting: Physical Therapy

## 2020-03-25 ENCOUNTER — Ambulatory Visit: Payer: Medicare Other | Attending: Obstetrics and Gynecology | Admitting: Physical Therapy

## 2020-04-01 ENCOUNTER — Ambulatory Visit: Payer: Medicare Other | Attending: Obstetrics and Gynecology | Admitting: Physical Therapy

## 2020-04-01 ENCOUNTER — Other Ambulatory Visit: Payer: Self-pay

## 2020-04-01 ENCOUNTER — Encounter: Payer: Self-pay | Admitting: Physical Therapy

## 2020-04-01 DIAGNOSIS — M6281 Muscle weakness (generalized): Secondary | ICD-10-CM | POA: Insufficient documentation

## 2020-04-01 DIAGNOSIS — R278 Other lack of coordination: Secondary | ICD-10-CM | POA: Diagnosis present

## 2020-04-01 NOTE — Therapy (Signed)
[facility] [address] [place], [place], [address] Phone: [phone]   Fax:  [phone]  Physical Therapy Evaluation  Patient Details  Name: Anna Ayala MRN: [id number] Date of Birth: Dec 09, 1975 Referring Provider (PT): Sherian Maroon   Encounter Date: 04/01/2020   PT End of Session - 04/01/20 1008    Visit Number 1    Number of Visits 8    Date for PT Re-Evaluation 05/27/20    PT Start Time 1000    PT Stop Time 1050    PT Time Calculation (min) 50 min    Activity Tolerance Patient tolerated treatment well    Behavior During Therapy [facility] for tasks assessed/performed           Past Medical History:  Diagnosis Date  . Aneurysm of anterior cerebral artery   . Anxiety   . Ataxia   . COPD (chronic obstructive pulmonary disease) (HCC)   . Depression   . Dysarthria   . Fibromyalgia   . GERD (gastroesophageal reflux disease)   . Head ache   . Hypertension   . Myalgia and myositis   . Seizure (HCC)   . Stroke (HCC)   . Tobacco abuse     Past Surgical History:  Procedure Laterality Date  . ABDOMINAL HYSTERECTOMY    . BRAIN SURGERY      There were no vitals filed for this visit.        [facility] PT Assessment - 04/01/20 1001      Assessment   Medical Diagnosis MIxed UI, high tone pelvic floor dysfunction    Referring Provider (PT) Sherian Maroon    Onset Date/Surgical Date 04/02/19    Hand Dominance Right    Next MD Visit March 2022    Prior Therapy None for this dx      Balance Screen   Has the patient fallen in the past 6 months Yes    How many times? unsure            PELVIC HEALTH PHYSICAL THERAPY EVALUATION  SCREENING Red Flags: None Have you had any night sweats? Unexplained weight loss? Saddle anesthesia? Unexplained changes in bowel or bladder habits?  Precautions: Seizure/Falls  SUBJECTIVE  Chief Complaint: Patient states that she gets limited warning when she needs to void. Patient  cannot delay for any amount of time between urge and void. Patient notes that her symptoms have gotten worse in the last 4 months. Patient has tried timed voiding without success. Patient had hysterectomy secondary to grade 4 uterine prolapse. Patient notes she has hernia in RLQ (GYN aware). Patient also notes hiatal hernia. Patient states that during vaginal exam she had high pain intensity throughout the pelvic floor.  Pertinent History:  Falls Positive for with seizure like activity. Patient has some loss of bladder control with increased duration seizures.  Scoliosis Negative. Pulmonary disease/dysfunction Positive for emphysema. Surgical history: Positive for abdominal hysterectomy with bladder sling..    Obstetrical History: G2P2 Deliveries: vaginal Tearing/Episiotomy: tearing (unsure grade) Birthing position: back  Gynecological History: Hysterectomy: Yes Abdominal Endometriosis: Negative Pain with exam: Yes   Urinary History: Incontinence: Positive. Onset: last year Triggers: urge, minimal with coughing/laughing/sneezing. Amount: Min/Mod/Complete Loss. Fluid Intake: 0 H20, 1 cup coffee caffeinated, 1.5L Dr Reino Kent Nocturia: 1-2x/night Frequency of urination: every 2 hours Pain with urination: Negative Difficulty initiating urination: Negative Intermittent stream: Negative. Frequent UTI: Negative.   Gastrointestinal History: [place] Stool Chart: Type 4 Frequency of BMs: 3x/week; hx 1x/day  AM Pain with defecation: Negative Straining with defecation: Negative Incontinence: Negative.   Sexual activity/pain: Pain with intercourse: Negative.   Initial penetration: No  Deep thrustingNo   Location of pain: N/A  Current activities:  Window shopping; watching videos  Patient Goals:  "When I can stop peeing on myself" "When I get that sensation to use the bathroom"   OBJECTIVE  Mental Status Patient is oriented to person, place and time.  Recent memory is intact.   Remote memory is intact.  Attention span and concentration are intact.  Expressive speech is intact.  Patient's fund of knowledge is within normal limits for educational level.  POSTURE/OBSERVATIONS:  Patient sits with slumped posture and slight right lateral trunk flexion. Posterior pelvic tilt.   GAIT: Grossly WNL, slight RLE ER throughout  RANGE OF MOTION: deferred 2/2 to time constraints   LEFT RIGHT  Lumbar forward flexion (65):      Lumbar extension (30):     Lumbar lateral flexion (25):     Thoracic and Lumbar rotation (30 degrees):       Hip Flexion (0-125):      Hip IR (0-45):     Hip ER (0-45):     Hip Abduction (0-40):     Hip extension (0-15):       STRENGTH: MMT deferred 2/2 to time constraints  RLE LLE  Hip Flexion    Hip Extension    Hip Abduction     Hip Adduction     Hip ER     Hip IR     Knee Extension    Knee Flexion    Dorsiflexion     Plantarflexion (seated)     ABDOMINAL: deferred 2/2 to time constraints Palpation: Diastasis: Scar mobility: Rib flare:  SPECIAL TESTS: deferred 2/2 to time constraints  PHYSICAL PERFORMANCE MEASURES: STS: WNL   EXTERNAL PELVIC EXAM: deferred 2/2 to time constraints Palpation: Breath coordination: Cued Lengthen: Cued Contraction: Cough:  INTERNAL VAGINAL EXAM: deferred 2/2 to time constraints Introitus Appears:  Skin integrity:  Scar mobility: Strength (PERF):  Symmetry: Palpation: Prolapse:   INTERNAL RECTAL EXAM: deferred 2/2 to time constraints Strength (PERF): Symmetry: Palpation: Prolapse:   OUTCOME MEASURES: FOTO Urinary 34; PFDI 100   ASSESSMENT Patient is a 45 year old presenting to clinic with chief complaints of urinary incontinence. Upon examination, patient demonstrates deficits in PFM coordination, PFM strength, IAP management, and posture as evidenced by urinary incontinence with urge and stress components, an inguinal and hiatal hernia, increased spinal flexion at rest.  Patient's responses on FOTO outcome measures (PFDI Urinary 100; Urainry 34) indicate severe functional limitations/disability/distress. Patient's progress may be limited due to hesitancy to participate in PFPT; however, patient's familial support is advantageous. Patient was able to achieve basic understanding of PFM functions during today's evaluation and responded positively to educational interventions. Patient will benefit from continued skilled therapeutic intervention to address deficits in PFM coordination, PFM strength, IAP management, and posture in order to increase function and improve overall QOL.  EDUCATION Patient educated on prognosis, POC, and provided with HEP including: increase water intake. Patient articulated understanding and returned demonstration. Patient will benefit from further education in order to maximize compliance and understanding for long-term therapeutic gains.  TREATMENT Neuromuscular Re-education: Patient educated on primary functions of the pelvic floor including: posture/balance, sexual pleasure, storage and elimination of waste from the body, abdominal cavity closure, and breath coordination.     Objective measurements completed on examination: See above findings.  PT Long Term Goals - 04/01/20 1853      PT LONG TERM GOAL #1   Title Patient will demonstrate independence with HEP in order to maximize therapeutic gains and improve carryover from physical therapy sessions to ADLs in the home and community.    Baseline IE: not demonstrated    Time 8    Period Weeks    Status New    Target Date 05/27/20      PT LONG TERM GOAL #2   Title Patient will demonstrate improved function as evidenced by a score of 52 on FOTO measure for full participation in activities at home and in the community.    Baseline IE: 34    Time 8    Period Weeks    Status New    Target Date 05/27/20      PT LONG TERM GOAL #3   Title Patient will demonstrate improved  function as evidenced by a score of <65 on FOTO PFDI Urinary measure for full participation in activities at home and in the community.    Baseline IE: 100    Time 8    Period Weeks    Status New    Target Date 05/27/20      PT LONG TERM GOAL #4   Title Patient will demonstrate circumferential and sequential contraction of >4/5 MMT, > 6 sec hold x10 and 5 consecutive quick flicks with </= 10 min rest between testing bouts, and relaxation of the PFM coordinated with breath for improved management of intra-abdominal pressure and normal bowel and bladder function without the presence of pain nor incontinence in order to improve participation at home and in the community.    Baseline IE: not demonstrated    Time 8    Period Weeks    Status New    Target Date 05/27/20      PT LONG TERM GOAL #5   Title Patient will demonstrate coordinated lengthening and relaxation of PFM with diaphragmatic inhalation in order to decrease spasm and allow for unrestricted elimination of urine/feces for improved overall QOL.    Baseline IE: not demonstrated    Time 8    Period Weeks    Status New    Target Date 05/27/20                  Plan - 04/01/20 1008    Clinical Impression Statement Patient is a 45 year old presenting to clinic with chief complaints of urinary incontinence. Upon examination, patient demonstrates deficits in PFM coordination, PFM strength, IAP management, and posture as evidenced by urinary incontinence with urge and stress components, an inguinal and hiatal hernia, increased spinal flexion at rest. Patient's responses on FOTO outcome measures (PFDI Urinary 100; Urainry 34) indicate severe functional limitations/disability/distress. Patient's progress may be limited due to hesitancy to participate in PFPT; however, patient's familial support is advantageous. Patient was able to achieve basic understanding of PFM functions during today's evaluation and responded positively to  educational interventions. Patient will benefit from continued skilled therapeutic intervention to address deficits in PFM coordination, PFM strength, IAP management, and posture in order to increase function and improve overall QOL.    Personal Factors and Comorbidities Behavior Pattern;Comorbidity 3+;Time since onset of injury/illness/exacerbation;Past/Current Experience;Age;Finances    Comorbidities anxiety, ataxia, COPD, fibromyalgia, depression, seizure disorder, GERD, HTN    Examination-Activity Limitations Lift;Squat;Stairs;Locomotion Level;Carry;Sleep    Examination-Participation Restrictions Interpersonal Relationship;Laundry;Meal Prep;Community Activity;Cleaning    Stability/Clinical Decision Making Evolving/Moderate complexity    Clinical Decision  Making Moderate    Rehab Potential Fair    PT Frequency 1x / week    PT Duration 8 weeks    PT Treatment/Interventions ADLs/Self Care Home Management;Moist Heat;Cryotherapy;Electrical Stimulation;Taping;Manual techniques;Patient/family education;Therapeutic exercise;Neuromuscular re-education;Therapeutic activities;Scar mobilization    PT Next Visit Plan physical assessment    PT Home Exercise Plan increase water intake    Consulted and Agree with Plan of Care Patient           Patient will benefit from skilled therapeutic intervention in order to improve the following deficits and impairments:  Postural dysfunction,Decreased strength,Decreased coordination,Decreased activity tolerance,Improper body mechanics,Increased muscle spasms,Decreased endurance  Visit Diagnosis: Other lack of coordination  Muscle weakness (generalized)     Problem List There are no problems to display for this patient.  Sheria Lang PT, DPT [phone]  04/01/2020, 6:54 PM  [facility] [address] [place], [place], [address] Phone: [phone]   Fax:  [phone]  Name: ARIONNA HOGGARD MRN: [id number] Date  of Birth: December 13, 1975

## 2020-04-08 ENCOUNTER — Ambulatory Visit: Payer: Medicare Other | Admitting: Physical Therapy

## 2020-04-08 ENCOUNTER — Other Ambulatory Visit: Payer: Self-pay

## 2020-04-08 ENCOUNTER — Encounter: Payer: Self-pay | Admitting: Physical Therapy

## 2020-04-08 ENCOUNTER — Encounter: Payer: Medicare Other | Admitting: Physical Therapy

## 2020-04-08 DIAGNOSIS — M6281 Muscle weakness (generalized): Secondary | ICD-10-CM

## 2020-04-08 DIAGNOSIS — R278 Other lack of coordination: Secondary | ICD-10-CM

## 2020-04-08 NOTE — Therapy (Signed)
[facility] [address] [place], [place], [address] Phone: [phone]   Fax:  [phone]  Physical Therapy Treatment  Patient Details  Name: Anna Ayala MRN: [id number] Date of Birth: 08-22-1975 Referring Provider (PT): Sherian Maroon   Encounter Date: 04/08/2020   PT End of Session - 04/08/20 0909    Visit Number 2    Number of Visits 8    Date for PT Re-Evaluation 05/27/20    PT Start Time 0900    PT Stop Time 0955    PT Time Calculation (min) 55 min    Activity Tolerance Patient tolerated treatment well    Behavior During Therapy [facility] for tasks assessed/performed           Past Medical History:  Diagnosis Date  . Aneurysm of anterior cerebral artery   . Anxiety   . Ataxia   . COPD (chronic obstructive pulmonary disease) (HCC)   . Depression   . Dysarthria   . Fibromyalgia   . GERD (gastroesophageal reflux disease)   . Head ache   . Hypertension   . Myalgia and myositis   . Seizure (HCC)   . Stroke (HCC)   . Tobacco abuse     Past Surgical History:  Procedure Laterality Date  . ABDOMINAL HYSTERECTOMY    . BRAIN SURGERY      There were no vitals filed for this visit.   Subjective Assessment - 04/08/20 0907    Subjective Patient notes no significant changes since evaluation. Patient notes she has her baseline pain from fibromyalgia but is not particularly more painful.    Currently in Pain? No/denies          TREATMENT  Pre-treatment assessment: RANGE OF MOTION:    LEFT RIGHT  Lumbar forward flexion (65):  WNL    Lumbar extension (30): WNL    Lumbar lateral flexion (25):  WNL WNL  Thoracic and Lumbar rotation (30 degrees):    WNL WNL  Hip Flexion (0-125):   WNL WNL*  Hip IR (0-45):  WNL WNL*  Hip ER (0-45):  WNL WNL*  Hip Abduction (0-40):  WNL* WNL*  Hip extension (0-15):  WNL WNL   L PSIS anterior to R PSIS   STRENGTH: MMT   RLE LLE  Hip Flexion 5 5  Hip Extension 5 5  Hip  Abduction  5 5  Hip Adduction  5 5  Hip ER  5 5  Hip IR  5 5  Knee Extension 5 5  Knee Flexion 5 5  Dorsiflexion  5 5  Plantarflexion (seated) 5 5   ABDOMINAL:  Palpation: no TTP Diastasis: none noted; good tensegrity throughout  EXTERNAL PELVIC EXAM:  Palpation: TTP throughout R side of ischiocavernosus Breath coordination: none present Cued Lengthen: unable to coordinate Cued Contraction: 3/5 MMT Cough: no PFM noted  Neuromuscular Re-education: Supine hooklying diaphragmatic breathing with VCs and TCs for downregulation of the nervous system and improved management of IAP Supine hooklying, PFM lengthening with inhalation. VCs and TCs to decrease compensatory patterns and encourage optimal relaxation of the PFM. Seated PFM lengthening with inhalation and washcloth feedback. VCs and TCs to decrease compensatory patterns.   Patient educated throughout session on appropriate technique and form using multi-modal cueing, HEP, and activity modification. Patient articulated understanding and returned demonstration.  Patient Response to interventions: Comfortable with HEP.  ASSESSMENT Patient presents to clinic with excellent motivation to participate in therapy. Patient demonstrates deficits in  PFM coordination,  PFM strength, IAP management, and posture. Patient able to achieve understanding of PFM lengthening during today's session and responded positively to active and educational interventions. Patient will benefit from continued skilled therapeutic intervention to address remaining deficits in  PFM coordination, PFM strength, IAP management, and posture in order to increase function and improve overall QOL.    PT Long Term Goals - 04/01/20 1853      PT LONG TERM GOAL #1   Title Patient will demonstrate independence with HEP in order to maximize therapeutic gains and improve carryover from physical therapy sessions to ADLs in the home and community.    Baseline IE: not  demonstrated    Time 8    Period Weeks    Status New    Target Date 05/27/20      PT LONG TERM GOAL #2   Title Patient will demonstrate improved function as evidenced by a score of 52 on FOTO measure for full participation in activities at home and in the community.    Baseline IE: 34    Time 8    Period Weeks    Status New    Target Date 05/27/20      PT LONG TERM GOAL #3   Title Patient will demonstrate improved function as evidenced by a score of <65 on FOTO PFDI Urinary measure for full participation in activities at home and in the community.    Baseline IE: 100    Time 8    Period Weeks    Status New    Target Date 05/27/20      PT LONG TERM GOAL #4   Title Patient will demonstrate circumferential and sequential contraction of >4/5 MMT, > 6 sec hold x10 and 5 consecutive quick flicks with </= 10 min rest between testing bouts, and relaxation of the PFM coordinated with breath for improved management of intra-abdominal pressure and normal bowel and bladder function without the presence of pain nor incontinence in order to improve participation at home and in the community.    Baseline IE: not demonstrated    Time 8    Period Weeks    Status New    Target Date 05/27/20      PT LONG TERM GOAL #5   Title Patient will demonstrate coordinated lengthening and relaxation of PFM with diaphragmatic inhalation in order to decrease spasm and allow for unrestricted elimination of urine/feces for improved overall QOL.    Baseline IE: not demonstrated    Time 8    Period Weeks    Status New    Target Date 05/27/20                 Plan - 04/08/20 0909    Clinical Impression Statement Patient presents to clinic with excellent motivation to participate in therapy. Patient demonstrates deficits in  PFM coordination, PFM strength, IAP management, and posture. Patient able to achieve understanding of PFM lengthening during today's session and responded positively to active and  educational interventions. Patient will benefit from continued skilled therapeutic intervention to address remaining deficits in  PFM coordination, PFM strength, IAP management, and posture in order to increase function and improve overall QOL.    Personal Factors and Comorbidities Behavior Pattern;Comorbidity 3+;Time since onset of injury/illness/exacerbation;Past/Current Experience;Age;Finances    Comorbidities anxiety, ataxia, COPD, fibromyalgia, depression, seizure disorder, GERD, HTN    Examination-Activity Limitations Lift;Squat;Stairs;Locomotion Level;Carry;Sleep    Examination-Participation Restrictions Interpersonal Relationship;Laundry;Meal Prep;Community Activity;Cleaning    Stability/Clinical Decision Making Evolving/Moderate complexity  Rehab Potential Fair    PT Frequency 1x / week    PT Duration 8 weeks    PT Treatment/Interventions ADLs/Self Care Home Management;Moist Heat;Cryotherapy;Electrical Stimulation;Taping;Manual techniques;Patient/family education;Therapeutic exercise;Neuromuscular re-education;Therapeutic activities;Scar mobilization    PT Next Visit Plan physical assessment    PT Home Exercise Plan increase water intake    Consulted and Agree with Plan of Care Patient           Patient will benefit from skilled therapeutic intervention in order to improve the following deficits and impairments:  Postural dysfunction,Decreased strength,Decreased coordination,Decreased activity tolerance,Improper body mechanics,Increased muscle spasms,Decreased endurance  Visit Diagnosis: Other lack of coordination  Muscle weakness (generalized)     Problem List There are no problems to display for this patient.  Sheria Lang PT, DPT [phone]  04/08/2020, 10:01 AM  [facility] [address] [place], [place], [address] Phone: [phone]   Fax:  [phone]  Name: Anna Ayala MRN: [id number] Date of Birth:  1975/08/13

## 2020-04-15 ENCOUNTER — Encounter: Payer: Medicare Other | Admitting: Physical Therapy

## 2020-04-18 ENCOUNTER — Encounter: Payer: Self-pay | Admitting: Physical Therapy

## 2020-04-18 ENCOUNTER — Ambulatory Visit: Payer: Medicare Other | Admitting: Physical Therapy

## 2020-04-18 ENCOUNTER — Other Ambulatory Visit: Payer: Self-pay

## 2020-04-18 DIAGNOSIS — R278 Other lack of coordination: Secondary | ICD-10-CM

## 2020-04-18 DIAGNOSIS — M6281 Muscle weakness (generalized): Secondary | ICD-10-CM

## 2020-04-18 NOTE — Therapy (Signed)
[facility] [address] [place], [place], [address] Phone: [phone]   Fax:  [phone]  Physical Therapy Treatment  Patient Details  Name: Anna Ayala MRN: [id number] Date of Birth: 03-21-1975 Referring Provider (PT): Sherian Maroon   Encounter Date: 04/18/2020   PT End of Session - 04/18/20 1308    Visit Number 3    Number of Visits 8    Date for PT Re-Evaluation 05/27/20    PT Start Time 0900    PT Stop Time 0943    PT Time Calculation (min) 43 min    Activity Tolerance Patient tolerated treatment well;Patient limited by fatigue;Treatment limited secondary to medical complications (Comment)    Behavior During Therapy [facility] for tasks assessed/performed           Past Medical History:  Diagnosis Date  . Aneurysm of anterior cerebral artery   . Anxiety   . Ataxia   . COPD (chronic obstructive pulmonary disease) (HCC)   . Depression   . Dysarthria   . Fibromyalgia   . GERD (gastroesophageal reflux disease)   . Head ache   . Hypertension   . Myalgia and myositis   . Seizure (HCC)   . Stroke (HCC)   . Tobacco abuse     Past Surgical History:  Procedure Laterality Date  . ABDOMINAL HYSTERECTOMY    . BRAIN SURGERY      There were no vitals filed for this visit.   Subjective Assessment - 04/18/20 0908    Subjective Patient reports that she is having a bad day. She and her spouse note a large seizure yesterday and continues to have difficulty with speech and tremors. Patient reports that this seizure aftermath lasts anywhere form a couple hours to a couple days. She has worked with speech therapy in the past and found it useful. Patient notes that she never urinates for more than a "3 [place] count". Patient states that when she sits down it is a fast flood of urine and then stops. Patient notes that she does not have a doctor because they closed the clinic and moved to [place].    Patient is accompained by:  Family member   spouse   Currently in Pain? No/denies           TREATMENT  Neuromuscular Re-education: Supine hooklying diaphragmatic breathing with VCs and TCs for downregulation of the nervous system and improved management of IAP Supine hooklying, TrA activation with exhalation. VCs and TCs to decrease compensatory patterns and minimize aggravation of the lumbar paraspinals.   Patient educated throughout session on appropriate technique and form using multi-modal cueing, HEP, and activity modification. Patient articulated understanding and returned demonstration.  Patient Response to interventions: During abdominal re-education, patient experienced 3 seizures of short duration (7 seconds, 9 seconds, 5 seconds) with roughly 2-3 minutes between bouts of seizure activity. Patient's partner was present and stabilized the patient's head, while DPT stabilized patient's body on plinth to prevent falling to the floor. Not apparent if patient loss control of bowel or bladder. DPT suggested rolling patient to side on multiple occasions, but patient's spouse assured DPT that supine with stabilization strategy was their preferred method. Patient's spouse intermittently used a finger sweep strategy to move the patient's tongue from blocking her airway. Patient with extremely limited speech one alert and seated after seizure activity.  ASSESSMENT Patient presents to clinic with decreased motivation to participate in therapy 2/2 to fatigue from seizure yesterday. Patient demonstrates  deficits in  PFM coordination, PFM strength, IAP management, and posture. Patient able to perform several quality repetitions of TrA activation during today's session prior to 3 bouts of short duration seizure like activity. Patient's spouse was present throughout the session and provided support for the patient during seizure like events. Session was discontinued once patient was clear of seizure like activity and articulated  her desire to leave. Patient will benefit from continued skilled therapeutic intervention to address remaining deficits in  PFM coordination, PFM strength, IAP management, and posture in order to increase function and improve overall QOL.    PT Long Term Goals - 04/01/20 1853      PT LONG TERM GOAL #1   Title Patient will demonstrate independence with HEP in order to maximize therapeutic gains and improve carryover from physical therapy sessions to ADLs in the home and community.    Baseline IE: not demonstrated    Time 8    Period Weeks    Status New    Target Date 05/27/20      PT LONG TERM GOAL #2   Title Patient will demonstrate improved function as evidenced by a score of 52 on FOTO measure for full participation in activities at home and in the community.    Baseline IE: 34    Time 8    Period Weeks    Status New    Target Date 05/27/20      PT LONG TERM GOAL #3   Title Patient will demonstrate improved function as evidenced by a score of <65 on FOTO PFDI Urinary measure for full participation in activities at home and in the community.    Baseline IE: 100    Time 8    Period Weeks    Status New    Target Date 05/27/20      PT LONG TERM GOAL #4   Title Patient will demonstrate circumferential and sequential contraction of >4/5 MMT, > 6 sec hold x10 and 5 consecutive quick flicks with </= 10 min rest between testing bouts, and relaxation of the PFM coordinated with breath for improved management of intra-abdominal pressure and normal bowel and bladder function without the presence of pain nor incontinence in order to improve participation at home and in the community.    Baseline IE: not demonstrated    Time 8    Period Weeks    Status New    Target Date 05/27/20      PT LONG TERM GOAL #5   Title Patient will demonstrate coordinated lengthening and relaxation of PFM with diaphragmatic inhalation in order to decrease spasm and allow for unrestricted elimination of  urine/feces for improved overall QOL.    Baseline IE: not demonstrated    Time 8    Period Weeks    Status New    Target Date 05/27/20                 Plan - 04/18/20 1309    Clinical Impression Statement Patient presents to clinic with decreased motivation to participate in therapy 2/2 to fatigue from seizure yesterday. Patient demonstrates deficits in  PFM coordination, PFM strength, IAP management, and posture. Patient able to perform several quality repetitions of TrA activation during today's session prior to 3 bouts of short duration seizure like activity. Patient's spouse was present throughout the session and provided support for the patient during seizure like events. Session was discontinued once patient was clear of seizure like activity and articulated her desire to  leave. Patient will benefit from continued skilled therapeutic intervention to address remaining deficits in  PFM coordination, PFM strength, IAP management, and posture in order to increase function and improve overall QOL.    Personal Factors and Comorbidities Behavior Pattern;Comorbidity 3+;Time since onset of injury/illness/exacerbation;Past/Current Experience;Age;Finances    Comorbidities anxiety, ataxia, COPD, fibromyalgia, depression, seizure disorder, GERD, HTN    Examination-Activity Limitations Lift;Squat;Stairs;Locomotion Level;Carry;Sleep    Examination-Participation Restrictions Interpersonal Relationship;Laundry;Meal Prep;Community Activity;Cleaning    Stability/Clinical Decision Making Evolving/Moderate complexity    Rehab Potential Fair    PT Frequency 1x / week    PT Duration 8 weeks    PT Treatment/Interventions ADLs/Self Care Home Management;Moist Heat;Cryotherapy;Electrical Stimulation;Taping;Manual techniques;Patient/family education;Therapeutic exercise;Neuromuscular re-education;Therapeutic activities;Scar mobilization    PT Home Exercise Plan increase water intake    Consulted and Agree with  Plan of Care Patient           Patient will benefit from skilled therapeutic intervention in order to improve the following deficits and impairments:  Postural dysfunction,Decreased strength,Decreased coordination,Decreased activity tolerance,Improper body mechanics,Increased muscle spasms,Decreased endurance  Visit Diagnosis: Other lack of coordination  Muscle weakness (generalized)     Problem List There are no problems to display for this patient.  Sheria Lang PT, DPT [phone]  04/18/2020, 1:44 PM  [facility] [address] [place], [place], [address] Phone: [phone]   Fax:  [phone]  Name: Anna Ayala MRN: [id number] Date of Birth: 1975/09/22

## 2020-04-22 ENCOUNTER — Ambulatory Visit: Payer: Medicare Other | Admitting: Physical Therapy

## 2020-04-22 ENCOUNTER — Encounter: Payer: Medicare Other | Admitting: Physical Therapy

## 2020-04-29 ENCOUNTER — Encounter: Payer: Medicare Other | Admitting: Physical Therapy

## 2020-04-29 ENCOUNTER — Ambulatory Visit: Payer: Medicare Other | Admitting: Physical Therapy

## 2020-05-06 ENCOUNTER — Encounter: Payer: Medicare Other | Admitting: Physical Therapy

## 2020-05-07 ENCOUNTER — Ambulatory Visit: Payer: Medicare Other | Attending: Obstetrics and Gynecology | Admitting: Physical Therapy

## 2020-05-13 ENCOUNTER — Encounter: Payer: Medicare Other | Admitting: Physical Therapy

## 2020-05-20 ENCOUNTER — Encounter: Payer: Medicare Other | Admitting: Physical Therapy

## 2020-05-27 ENCOUNTER — Encounter: Payer: Medicare Other | Admitting: Physical Therapy

## 2020-06-03 ENCOUNTER — Encounter: Payer: Medicare Other | Admitting: Physical Therapy

## 2020-08-10 ENCOUNTER — Other Ambulatory Visit: Payer: Self-pay

## 2020-08-10 ENCOUNTER — Ambulatory Visit
Admission: EM | Admit: 2020-08-10 | Discharge: 2020-08-10 | Disposition: A | Discharge status: Home / Self Care | Payer: Medicare Other

## 2020-08-10 DIAGNOSIS — J441 Chronic obstructive pulmonary disease with (acute) exacerbation: Secondary | ICD-10-CM | POA: Diagnosis not present

## 2020-08-10 MED ORDER — PROMETHAZINE-DM 6.25-15 MG/5ML PO SYRP: 5.0000 mL | ORAL_SOLUTION | Freq: Four times a day (QID) | ORAL | 0 refills | Status: DC | PRN

## 2020-08-10 MED ORDER — BENZONATATE 100 MG PO CAPS: 200.0000 mg | ORAL_CAPSULE | Freq: Three times a day (TID) | ORAL | 0 refills | Status: DC

## 2020-08-10 MED ORDER — PREDNISONE 10 MG (21) PO TBPK: ORAL_TABLET | ORAL | 0 refills | Status: DC

## 2020-08-10 NOTE — Discharge Instructions (Addendum)
Start the prednisone Dosepak and take according to the package instructions.  Take it each morning with breakfast.  Continue your albuterol inhaler and your Flovent inhaler to help with pulmonary inflammation and constriction as a result of your chemical exposure.  Use the Tessalon Perles during the day as needed for cough and the Promethazine DM cough syrup at bedtime.  The cough syrup will make you drowsy.  Refrain from using ammonia, bleach, or any other harsh chemical.  Do not mix the 2 as this could potentially cause chlorine gas which might kill you.  Return for reevaluation, or see your primary care provider, for new or worsening symptoms.  If you feel like you are unable to catch her breath and have worsening shortness of breath please go to the ER.

## 2020-08-10 NOTE — ED Provider Notes (Signed)
[facility]    CSN: [id number] Arrival date & time: 08/10/20  0847      History   Chief Complaint Chief Complaint  Patient presents with   Cough    HPI Anna Ayala is a 45 y.o. female.   HPI  45 year old female here for evaluation of multiple complaints.  Patient reports that 2 weeks ago she was cleaning her bathroom with a mixture of bleach and ammonia which caused inflammation of her respiratory tree and she has been coughing ever since.  She reports that she is coughing up some green sputum and has had some shortness of breath requiring her to use her rescue inhaler.  She also reports wheezing.  She states that she has had some nasal congestion in between her eyes and postnasal drip.  She denies fever.  She does report that she is in a fibromyalgia flare and she is taking both Lyrica and gabapentin for that.  Past Medical History:  Diagnosis Date   Aneurysm of anterior cerebral artery    Anxiety    Ataxia    COPD (chronic obstructive pulmonary disease) (HCC)    Depression    Dysarthria    Fibromyalgia    GERD (gastroesophageal reflux disease)    Head ache    Hypertension    Myalgia and myositis    Seizure (HCC)    Stroke (HCC)    Tobacco abuse     There are no problems to display for this patient.   Past Surgical History:  Procedure Laterality Date   ABDOMINAL HYSTERECTOMY     BRAIN SURGERY      OB History   No obstetric history on file.      Home Medications    Prior to Admission medications   Medication Sig Start Date End Date Taking? Authorizing Provider  albuterol (PROVENTIL) (2.5 MG/3ML) 0.083% nebulizer solution Take 2.5 mg by nebulization every 6 (six) hours as needed for wheezing or shortness of breath.   Yes [provider]  albuterol (VENTOLIN HFA) 108 (90 Base) MCG/ACT inhaler Inhale into the lungs every 6 (six) hours as needed for wheezing or shortness of breath.   Yes [provider]  aspirin 325 MG tablet  Take 325 mg by mouth daily.   Yes [provider]  benzonatate (TESSALON) 100 MG capsule Take 2 capsules (200 mg total) by mouth every 8 (eight) hours. 08/10/20  Yes Becky Augusta, NP  cetirizine (ZYRTEC) 10 MG tablet Take 10 mg by mouth daily.   Yes [provider]  clonazePAM (KLONOPIN) 0.5 MG tablet Take 0.5 mg by mouth at bedtime.   Yes [provider]  fluticasone (FLOVENT HFA) 110 MCG/ACT inhaler Inhale into the lungs 2 (two) times daily.   Yes [provider]  furosemide (LASIX) 40 MG tablet Take 40 mg by mouth.   Yes [provider]  hydrOXYzine (ATARAX/VISTARIL) 25 MG tablet Take 25 mg by mouth 2 (two) times daily.   Yes [provider]  ketoconazole (NIZORAL) 2 % cream Apply 1 application topically daily.   Yes [provider]  lamoTRIgine (LAMICTAL) 25 MG tablet Take by mouth. 08/03/20  Yes [provider]  levETIRAcetam (KEPPRA) 750 MG tablet Take 1,500 mg by mouth daily.   Yes [provider]  methocarbamol (ROBAXIN) 500 MG tablet Take 1-2 tablets by mouth 3 (three) times daily as needed. 06/23/20  Yes [provider]  pantoprazole (PROTONIX) 40 MG tablet Take 40 mg by mouth daily.  Yes [provider]  predniSONE (STERAPRED UNI-PAK 21 TAB) 10 MG (21) TBPK tablet Take 6 tablets on day 1, 5 tablets day 2, 4 tablets day 3, 3 tablets day 4, 2 tablets day 5, 1 tablet day 6 08/10/20  Yes Becky Augustayan, Adrine Hayworth, NP  pregabalin (LYRICA) 100 MG capsule Take 100 mg by mouth 2 (two) times daily.   Yes [provider]  promethazine-dextromethorphan (PROMETHAZINE-DM) 6.25-15 MG/5ML syrup Take 5 mLs by mouth 4 (four) times daily as needed. 08/10/20  Yes Becky Augustayan, Elsa Ploch, NP  propranolol (INDERAL) 20 MG tablet Take 20 mg by mouth daily.   Yes [provider]  QUEtiapine (SEROQUEL) 50 MG tablet SMARTSIG:4 Tablet(s) By Mouth Every Night PRN 08/03/20  Yes [provider]  sertraline (ZOLOFT) 25 MG  tablet Take 25 mg by mouth daily.   Yes [provider]  tiZANidine (ZANAFLEX) 2 MG tablet Take 2 mg by mouth once.   Yes [provider]    Family History Family History  Adopted: Yes    Social History Social History   Tobacco Use   Smoking status: Every Day    Pack years: 0.00    Types: Cigarettes   Smokeless tobacco: Never  Vaping Use   Vaping Use: Never used  Substance Use Topics   Alcohol use: No     Allergies   Bee venom, Chantix [varenicline], Spiriva handihaler [tiotropium bromide monohydrate], Tramadol, and Diclofenac   Review of Systems Review of Systems  Constitutional:  Negative for activity change and appetite change.  HENT:  Positive for postnasal drip and sinus pain.   Respiratory:  Positive for cough and wheezing.   Neurological:  Positive for headaches.  Hematological: Negative.   Psychiatric/Behavioral: Negative.      Physical Exam Triage Vital Signs ED Triage Vitals  Enc Vitals Group     BP 08/10/20 0900 135/70     Pulse Rate 08/10/20 0900 97     Resp 08/10/20 0900 18     Temp 08/10/20 0900 98.5 F (36.9 C)     Temp Source 08/10/20 0900 Oral     SpO2 08/10/20 0900 97 %     Weight 08/10/20 0858 206 lb 3.2 oz (93.5 kg)     Height --      Head Circumference --      Peak Flow --      Pain Score 08/10/20 0900 10     Pain Loc --      Pain Edu? --      Excl. in GC? --    No data found.  Updated Vital Signs BP 135/70 (BP Location: Left Arm)   Pulse 97   Temp 98.5 F (36.9 C) (Oral)   Resp 18   Wt 206 lb 3.2 oz (93.5 kg)   LMP 09/16/2016   SpO2 97%   BMI 30.45 kg/m   Visual Acuity Right Eye Distance:   Left Eye Distance:   Bilateral Distance:    Right Eye Near:   Left Eye Near:    Bilateral Near:     Physical Exam Vitals and nursing note reviewed.  Constitutional:      General: She is not in acute distress.    Appearance: Normal appearance. She is not ill-appearing.  HENT:     Head: Normocephalic and  atraumatic.     Right Ear: Tympanic membrane, ear canal and external ear normal. There is no impacted cerumen.     Left Ear: Tympanic membrane, ear canal and external ear  normal. There is no impacted cerumen.     Nose: Congestion present. No rhinorrhea.     Mouth/Throat:     Mouth: Mucous membranes are moist.     Pharynx: Oropharynx is clear. Posterior oropharyngeal erythema present.  Cardiovascular:     Rate and Rhythm: Normal rate and regular rhythm.     Pulses: Normal pulses.     Heart sounds: Normal heart sounds. No murmur heard.   No gallop.  Pulmonary:     Effort: Pulmonary effort is normal.     Breath sounds: Normal breath sounds. No wheezing, rhonchi or rales.  Skin:    General: Skin is warm and dry.     Capillary Refill: Capillary refill takes less than 2 seconds.     Findings: No erythema or rash.  Neurological:     General: No focal deficit present.     Mental Status: She is alert and oriented to person, place, and time.  Psychiatric:        Mood and Affect: Mood normal.        Behavior: Behavior normal.        Thought Content: Thought content normal.        Judgment: Judgment normal.     UC Treatments / Results  Labs (all labs ordered are listed, but only abnormal results are displayed) Labs Reviewed - No data to display  EKG   Radiology No results found.  Procedures Procedures (including critical care time)  Medications Ordered in UC Medications - No data to display  Initial Impression / Assessment and Plan / UC Course  I have reviewed the triage vital signs and the nursing notes.  Pertinent labs & imaging results that were available during my care of the patient were reviewed by me and considered in my medical decision making (see chart for details).  Patient is a nontoxic-appearing 45 year old female here for evaluation of respiratory complaints as outlined in the HPI above.  Patient reports that she was working with a mixture of bleach and ammonia  though states she did not combine the 2 directly together.  She reports she was using them in her bathroom and use bleach to clean her toilet and ammonia to clean the remainder of her bathroom fixtures.  She has a history of COPD and has been told not to use chemical such as at by her PCP.  She reports that she has been coughing and that her cough is productive, she is had shortness of breath and wheezing requiring use of her rescue inhaler.  She reports that the coughing is also triggered a fibromyalgia flare for which she is currently taking Lyrica and gabapentin.  Patient's physical exam reveals erythematous and edematous nasal mucosa with no nasal discharge.  Oropharyngeal exam reveals posterior oropharyngeal erythema with clear postnasal drip.  No cervical lymphadenopathy appreciated exam.  Cardiopulmonary exam is benign.  Patient does have tenderness to percussion of her frontal and maxillary sinuses.  Suspect patient has COPD exacerbation and upper airway inflammation as a result of her chemical exposure.  I have encouraged her to refrain from using those chemicals in the future.  We will treat her for COPD exacerbation with prednisone, Tessalon Perles, and Promethazine DM cough syrup to help with her cough as she states she coughs more at night.  She reports shortness of breath allergy to 2 Tropium so we will hold off on ipratropium nasal spray for the time being.  Patient is currently taking Flovent inhaled as well and I  will encourage her to continue that.   Final Clinical Impressions(s) / UC Diagnoses   Final diagnoses:  COPD exacerbation [facility])   Discharge Instructions   None    ED Prescriptions     Medication Sig Dispense Auth. Provider   benzonatate (TESSALON) 100 MG capsule Take 2 capsules (200 mg total) by mouth every 8 (eight) hours. 21 capsule Becky Augusta, NP   promethazine-dextromethorphan (PROMETHAZINE-DM) 6.25-15 MG/5ML syrup Take 5 mLs by mouth 4 (four) times daily as needed.  118 mL Becky Augusta, NP   predniSONE (STERAPRED UNI-PAK 21 TAB) 10 MG (21) TBPK tablet Take 6 tablets on day 1, 5 tablets day 2, 4 tablets day 3, 3 tablets day 4, 2 tablets day 5, 1 tablet day 6 21 tablet Becky Augusta, NP      PDMP not reviewed this encounter.   Becky Augusta, NP 08/10/20 [id number]

## 2020-08-10 NOTE — ED Triage Notes (Signed)
Patient states that she was cleaning with chemicals 2 weeks ago and irritated her lungs. States that she has COPD. Reports that she has pain all over body. Hurts to breathe as well.

## 2020-10-01 ENCOUNTER — Telehealth: Payer: Self-pay

## 2020-10-01 NOTE — Progress Notes (Deleted)
[facility] New Patient Evaluation and Consultation  Referring Provider: [facility] [place] [facility] PCP: [facility] [place] [facility] Date of Service: 10/02/2020  SUBJECTIVE Chief Complaint: No chief complaint on file.  History of Present Illness: Anna Ayala is a 45 y.o. {ED SANE [phone] female seen presenting for evaluation of ***.    ***Review of records significant for: ***  Urinary Symptoms: {urine leakage?:24754} Leaks *** time(s) per {days/wks/mos/yrs:310907}.  Pad use: {NUMBERS 1-10:18281} {pad option:24752} per day.   She {ACTION; IS/IS UMP:53614431} bothered by her UI symptoms.  Day time voids ***.  Nocturia: *** times per night to void. Voiding dysfunction: she {empties:24755} her bladder well.  {DOES NOT does:27190} use a catheter to empty bladder.  When urinating, she feels {urine symptoms:24756} Drinks: *** per day  UTIs: {NUMBERS 1-10:18281} UTI's in the last year.   {ACTIONS;DENIES/REPORTS:21021675} history of {urologic concerns:24757}  Pelvic Organ Prolapse Symptoms:                  She {denies/ admits to:24761} a feeling of a bulge the vaginal area. It has been present for {NUMBER 1-10:22536} {days/wks/mos/yrs:310907}.  She {denies/ admits to:24761} seeing a bulge.  This bulge {ACTION; IS/IS VQM:08676195} bothersome.  Bowel Symptom: Bowel movements: *** time(s) per {Time; day/week/month:13537} Stool consistency: {stool consistency:24758} Straining: {yes/no:19897}.  Splinting: {yes/no:19897}.  Incomplete evacuation: {yes/no:19897}.  She {denies/ admits to:24761} accidental bowel leakage / fecal incontinence  Occurs: *** time(s) per {Time; day/week/month:13537}  Consistency with leakage: {stool consistency:24758} Bowel regimen: {bowel regimen:24759} Last colonoscopy: Date ***, Results ***  Sexual Function Sexually active: {yes/no:19897}.  Sexual orientation: {Sexual [phone]} Pain with sex: {pain with sex:24762}  Pelvic  Pain {denies/ admits to:24761} pelvic pain Location: *** Pain occurs: *** Prior pain treatment: *** Improved by: *** Worsened by: ***   Past Medical History:  Past Medical History:  Diagnosis Date   Aneurysm of anterior cerebral artery    Anxiety    Ataxia    COPD (chronic obstructive pulmonary disease) (HCC)    Depression    Dysarthria    Fibromyalgia    GERD (gastroesophageal reflux disease)    Head ache    Hypertension    Myalgia and myositis    Seizure (HCC)    Stroke (HCC)    Tobacco abuse      Past Surgical History:   Past Surgical History:  Procedure Laterality Date   ABDOMINAL HYSTERECTOMY     BRAIN SURGERY       Past OB/GYN History: G{NUMBERS 1-10:18281} P{NUMBERS 1-10:18281} Vaginal deliveries: ***,  Forceps/ Vacuum deliveries: ***, Cesarean section: *** Menopausal: {menopausal:24763} Contraception: ***. Last pap smear was ***.  Any history of abnormal pap smears: {yes/no:19897}.   Medications: She has a current medication list which includes the following prescription(s): albuterol, albuterol, aspirin, benzonatate, cetirizine, clonazepam, fluticasone, furosemide, hydroxyzine, ketoconazole, lamotrigine, levetiracetam, methocarbamol, pantoprazole, prednisone, pregabalin, promethazine-dextromethorphan, propranolol, quetiapine, sertraline, and tizanidine.   Allergies: Patient is allergic to bee venom, chantix [varenicline], spiriva handihaler [tiotropium bromide monohydrate], tramadol, and diclofenac.   Social History:  Social History   Tobacco Use   Smoking status: Every Day    Types: Cigarettes   Smokeless tobacco: Never  Vaping Use   Vaping Use: Never used  Substance Use Topics   Alcohol use: No   Drug use: Not Currently    Relationship status: {relationship status:24764} She lives with ***.   She {ACTION; IS/IS KDT:26712458} employed ***. Regular exercise: {Yes/No:304960894} History of abuse: {Yes/No:304960894}  Family History:   Family  History  Adopted: Yes  Review of Systems: ROS   OBJECTIVE Physical Exam: There were no vitals filed for this visit.  Physical Exam   GU / Detailed Urogynecologic Evaluation:  Pelvic Exam: Normal external female genitalia; Bartholin's and Skene's glands normal in appearance; urethral meatus normal in appearance, no urethral masses or discharge.   CST: {gen negative/positive:315881}  Reflexes: bulbocavernosis {DESC; PRESENT/NOT PRESENT:21021351}, anocutaneous {DESC; PRESENT/NOT PRESENT:21021351} ***bilaterally.  Speculum exam reveals normal vaginal mucosa {With/Without:20273} atrophy. Cervix {exam; gyn cervix:30847}. Uterus {exam; pelvic uterus:30849}. Adnexa {exam; adnexa:12223}.    s/p hysterectomy: Speculum exam reveals normal vaginal mucosa {With/Without:20273}  atrophy and normal vaginal cuff.  Adnexa {exam; adnexa:12223}.    With apex supported, anterior compartment defect was {reduced:24765}  Pelvic floor strength {Roman # I-V:19040}/V, puborectalis {Roman # I-V:19040}/V external anal sphincter {Roman # I-V:19040}/V  Pelvic floor musculature: Right levator {Tender/Non-tender:20250}, Right obturator {Tender/Non-tender:20250}, Left levator {Tender/Non-tender:20250}, Left obturator {Tender/Non-tender:20250}  POP-Q:   POP-Q                                               Aa                                               Ba                                                 C                                                Gh                                               Pb                                               tvl                                                Ap                                               Bp                                                 D     Rectal Exam:  Normal sphincter tone, {rectocele:24766} distal rectocele,  enterocoele {DESC; PRESENT/NOT PRESENT:21021351}, no rectal masses, {sign of:24767} dyssynergia when asking the  patient to bear down.  Post-Void Residual (PVR) by Bladder Scan: In order to evaluate bladder emptying, we discussed obtaining a postvoid residual and she agreed to this procedure.  Procedure: The ultrasound unit was placed on the patient's abdomen in the suprapubic region after the patient had voided. A PVR of *** ml was obtained by bladder scan.  Laboratory Results: @ENCLABS @   ***I visualized the urine specimen, noting the specimen to be {urine color:24768}  ASSESSMENT AND PLAN Ms. Wiseman is a 45 y.o. with: No diagnosis found.    54, MD   Medical Decision Making:  - Reviewed/ ordered a clinical laboratory test - Reviewed/ ordered a radiologic study - Reviewed/ ordered medicine test - Decision to obtain old records - Discussion of management of or test interpretation with an external physician / other healthcare professional  - Assessment requiring independent historian - Review and summation of prior records - Independent review of image, tracing or specimen

## 2020-10-01 NOTE — Telephone Encounter (Signed)
Anna Ayala is a 45 y.o. female was called and contacted re: New pt Pre appt call to collect history information. Pt verified using 2 identifiers. Confirmation that I am speaking with the correct person.  Chart was updated: -Allergy -Medication -Confirm pharmacy -OB history  Pt was notified to arrive 15 min early and we will need a urine sample when she arrives. Pt verbalized understanding.  --- Pt thought her appt was at 3:30pm. Pt's appt was moved to 9:40am. Pt is happy with the change and confirmed her appt.

## 2020-10-02 ENCOUNTER — Ambulatory Visit: Payer: Medicare Other | Admitting: Obstetrics and Gynecology

## 2020-12-04 IMAGING — CR RIGHT ELBOW - COMPLETE 3+ VIEW
5 series · 5 of 5 positions shown · non-contrast
Comparison: None.

CLINICAL DATA: Recent seizure now with right elbow pain.

EXAM:
RIGHT ELBOW - COMPLETE 3+ VIEW

[elbow ap (1 of 2)]
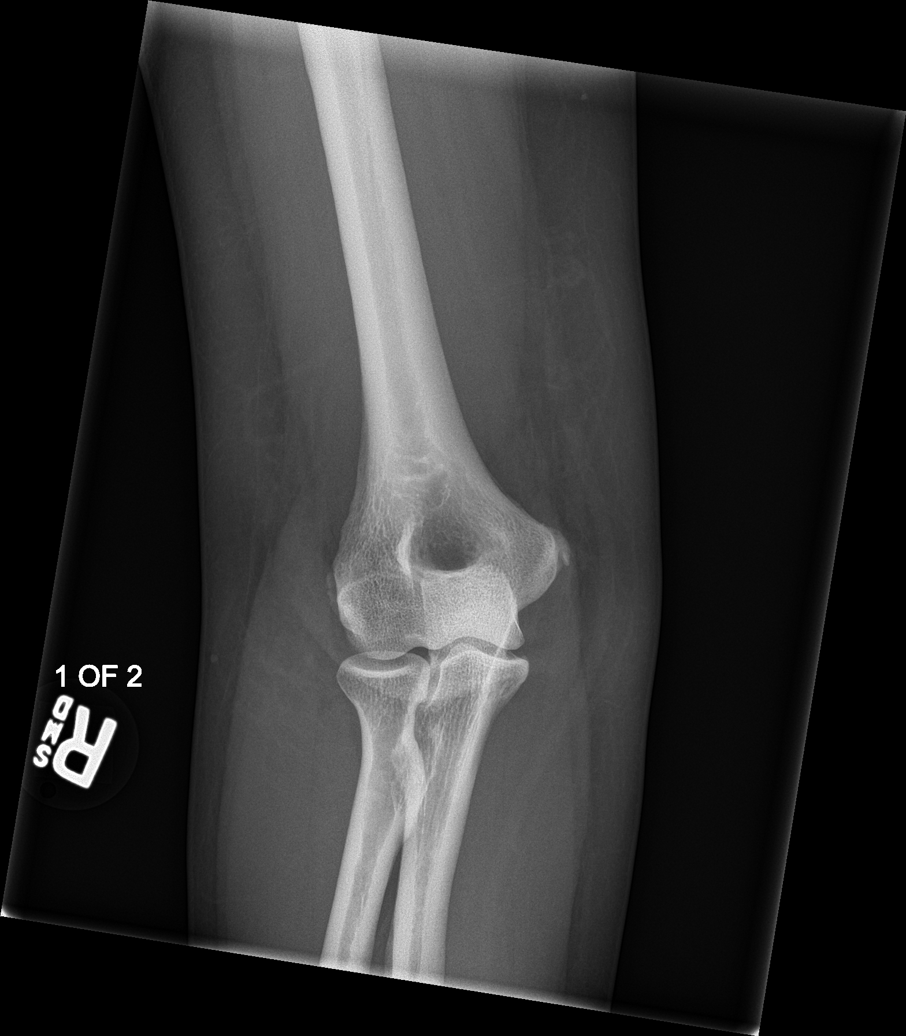

[elbow obl (1 of 2)]
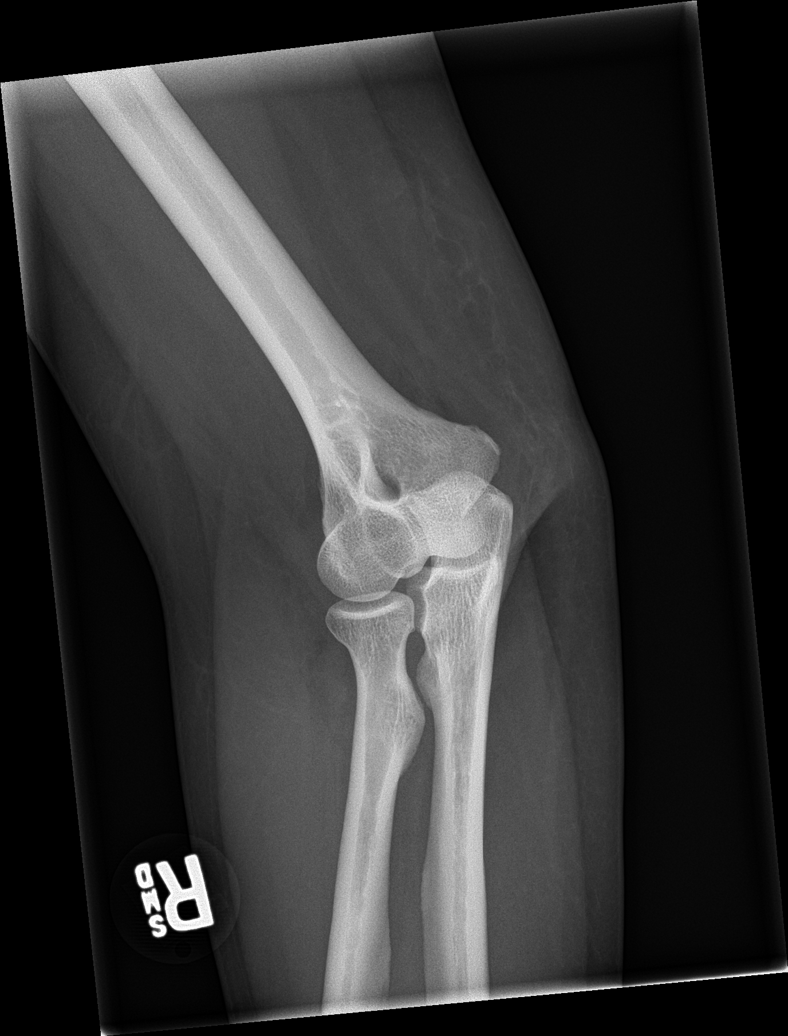

[elbow obl (2 of 2)]
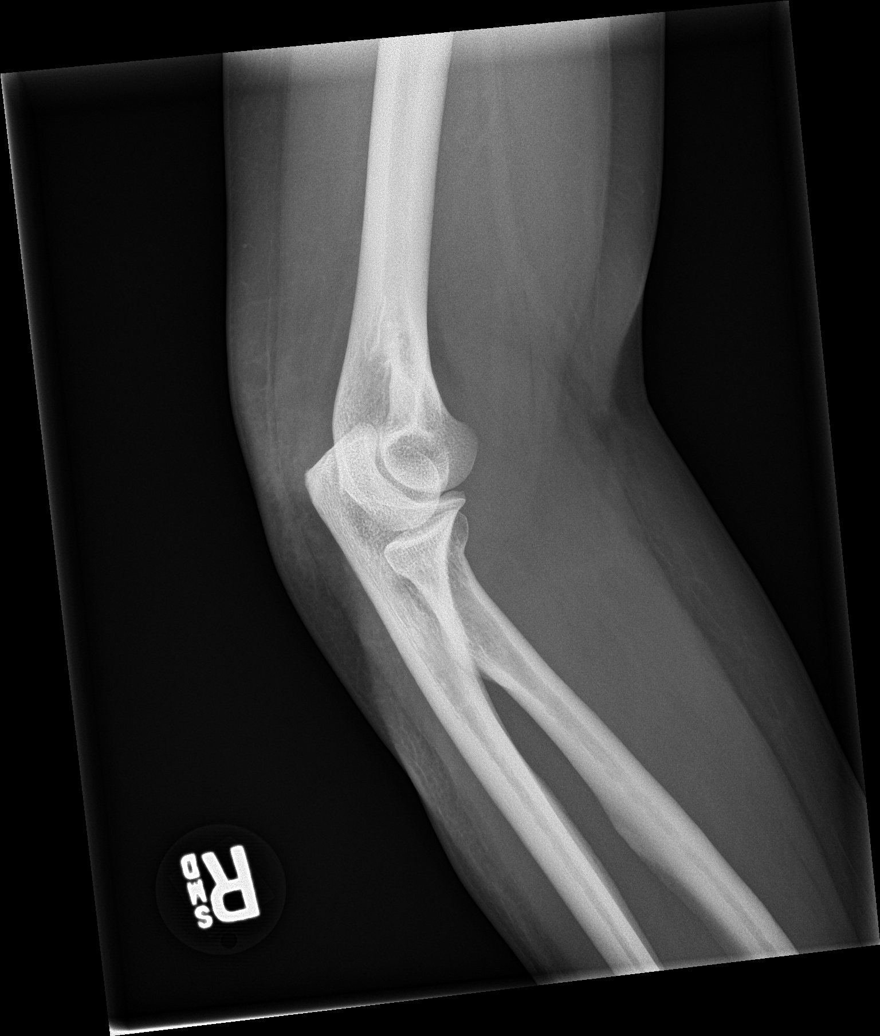

[elbow lat]
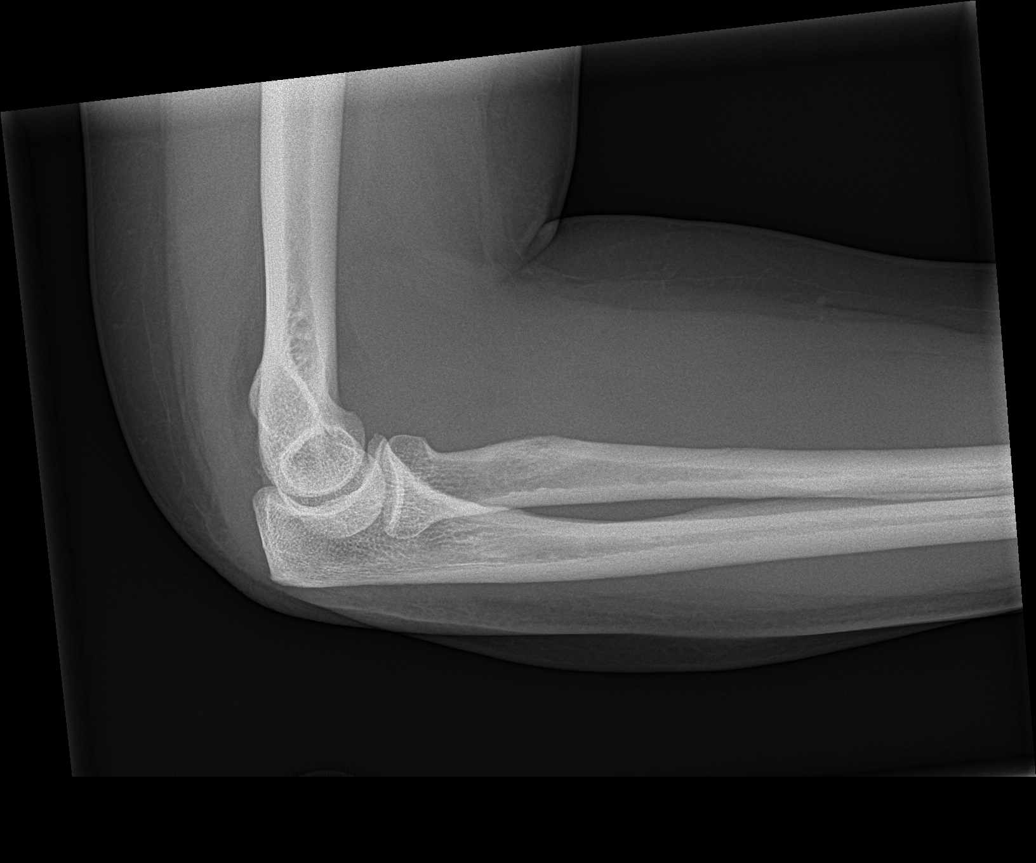

[elbow ap (2 of 2)]
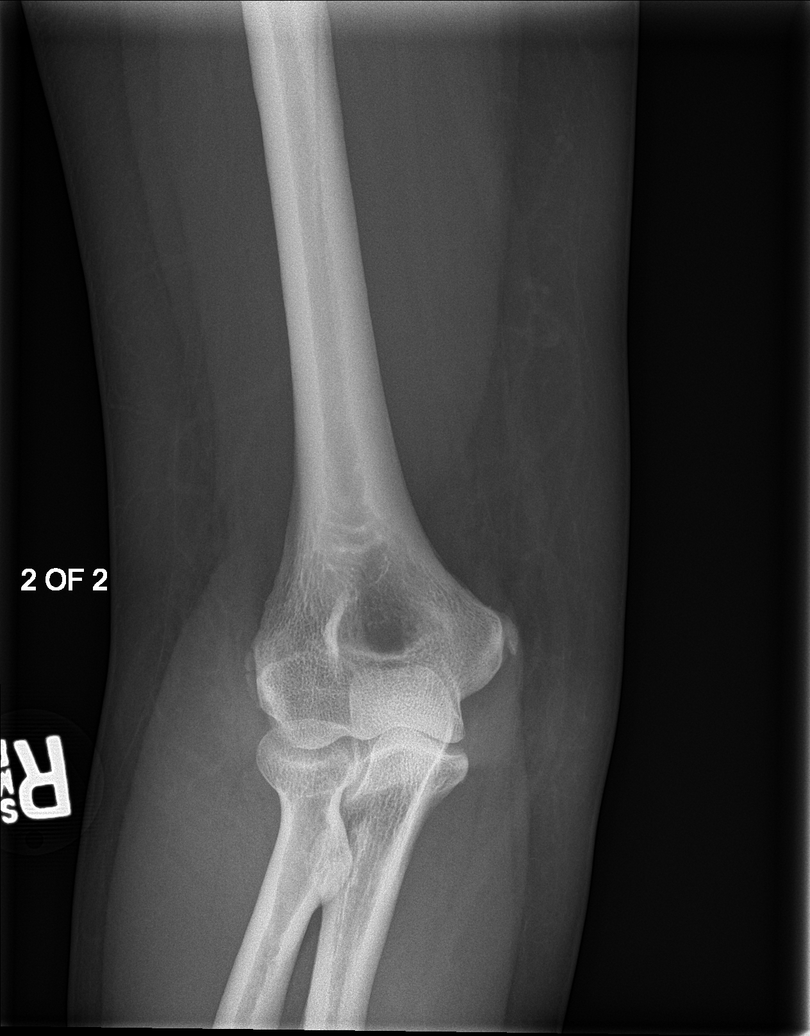

[5 of 5 positions shown; findings below may reference images not displayed]

FINDINGS: Note is made of a small elbow joint effusion however it a definitive
acute displaced fracture is not identified with special attention
paid to the radial head. Enthesopathic change noted about the medial
and lateral epicondyles, likely the sequela of remote avulsive
injury. There is a minimal amount of soft tissue swelling about the
elbow. No radiopaque foreign body.
IMPRESSION: Small elbow joint effusion suggestive of an intra-articular
fracture, though a definitive displaced fracture is not identified
with special attention paid to the radial head. Prophylactic
splinting and follow-up radiographs could be performed in 7-10 days
as indicated.
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
## 2020-12-10 ENCOUNTER — Encounter: Payer: Self-pay | Admitting: Obstetrics and Gynecology

## 2020-12-10 ENCOUNTER — Other Ambulatory Visit: Payer: Self-pay

## 2020-12-10 ENCOUNTER — Ambulatory Visit (INDEPENDENT_AMBULATORY_CARE_PROVIDER_SITE_OTHER): Payer: Medicare HMO | Admitting: Obstetrics and Gynecology

## 2020-12-10 VITALS — BP 120/64 | HR 80 | Ht 69.0 in | Wt 204.0 lb

## 2020-12-10 DIAGNOSIS — N393 Stress incontinence (female) (male): Secondary | ICD-10-CM | POA: Diagnosis not present

## 2020-12-10 DIAGNOSIS — R350 Frequency of micturition: Secondary | ICD-10-CM

## 2020-12-10 DIAGNOSIS — N8110 Cystocele, unspecified: Secondary | ICD-10-CM | POA: Diagnosis not present

## 2020-12-10 DIAGNOSIS — M62838 Other muscle spasm: Secondary | ICD-10-CM

## 2020-12-10 DIAGNOSIS — N3941 Urge incontinence: Secondary | ICD-10-CM | POA: Diagnosis not present

## 2020-12-10 LAB — POCT URINALYSIS DIPSTICK
Appearance: NORMAL
Bilirubin, UA: NEGATIVE
Blood, UA: NEGATIVE
Glucose, UA: NEGATIVE
Ketones, UA: NEGATIVE
Leukocytes, UA: NEGATIVE
Nitrite, UA: NEGATIVE
Protein, UA: POSITIVE — AB
Spec Grav, UA: >=1.03 — AB (ref 1.010–1.025)
Urobilinogen, UA: 0.2 E.U./dL
pH, UA: 5.5 (ref 5.0–8.0)

## 2020-12-10 NOTE — Patient Instructions (Signed)

## 2020-12-25 ENCOUNTER — Ambulatory Visit: Payer: Medicare HMO | Admitting: Physical Therapy

## 2021-01-02 ENCOUNTER — Other Ambulatory Visit: Payer: Self-pay

## 2021-01-02 ENCOUNTER — Encounter: Payer: Self-pay | Admitting: Physical Therapy

## 2021-01-02 ENCOUNTER — Ambulatory Visit: Payer: Medicare HMO | Attending: Obstetrics and Gynecology | Admitting: Physical Therapy

## 2021-01-02 DIAGNOSIS — R278 Other lack of coordination: Secondary | ICD-10-CM | POA: Diagnosis present

## 2021-01-02 DIAGNOSIS — M6281 Muscle weakness (generalized): Secondary | ICD-10-CM | POA: Insufficient documentation

## 2021-01-02 NOTE — Therapy (Signed)
[facility] [address]. [place], [place], [address] Phone: [phone]   Fax:  [phone]  Physical Therapy Evaluation  Patient Details  Name: Anna Ayala MRN: [id number] Date of Birth: August 11, 1975 Referring Provider (PT): Lanetta Inch   Encounter Date: 01/02/2021   PT End of Session - 01/02/21 1018     Visit Number 1    Number of Visits 12    Date for PT Re-Evaluation 03/27/21    PT Start Time 1030    PT Stop Time 1115    PT Time Calculation (min) 45 min    Activity Tolerance Patient tolerated treatment well    Behavior During Therapy [facility] for tasks assessed/performed             Past Medical History:  Diagnosis Date   Aneurysm of anterior cerebral artery    Anxiety    Ataxia    COPD (chronic obstructive pulmonary disease) (HCC)    Depression    Dysarthria    Fibromyalgia    GERD (gastroesophageal reflux disease)    Head ache    Hypertension    Myalgia and myositis    Seizure (HCC)    Stroke (HCC)    Tobacco abuse     Past Surgical History:  Procedure Laterality Date   BRAIN SURGERY     ROBOTIC ASSISTED TOTAL HYSTERECTOMY WITH SACROCOLPOPEXY     supracervical hyst, with posterior repair    There were no vitals filed for this visit.        [facility] PT Assessment - 01/02/21 0001       Assessment   Medical Diagnosis UI    Referring Provider (PT) Lanetta Inch    Onset Date/Surgical Date 04/02/19    Hand Dominance Right    Prior Therapy Yes; d/c due to uncontrolled seizures      Balance Screen   Has the patient fallen in the past 6 months Yes    How many times? unsure             PELVIC HEALTH PHYSICAL THERAPY EVALUATION  SCREENING Red Flags: None Have you had any night sweats? Unexplained weight loss? Saddle anesthesia? Unexplained changes in bowel or bladder habits?  Precautions: Seizure/Falls  SUBJECTIVE  Chief Complaint: Patient notes the only change is that her  bladder has fallen again. Patient would like to avoid surgery and is interested in trialing PT again. Patient notes that she has multiple medication adjustments to manage seizure. Patient does note that pelvic floor pain is less than last episode of care. Patient notes that if she consumes water, she has an extremely strong stream. Patient notes that when taking diuretic she has increased urge and leakage.   04/01/2020: Patient states that she gets limited warning when she needs to void. Patient cannot delay for any amount of time between urge and void. Patient notes that her symptoms have gotten worse in the last 4 months. Patient has tried timed voiding without success. Patient had hysterectomy secondary to grade 4 uterine prolapse. Patient notes she has hernia in RLQ (GYN aware). Patient also notes hiatal hernia. Patient states that during vaginal exam she had high pain intensity throughout the pelvic floor.  Pertinent History:  Falls Positive for with seizure like activity. Patient has some loss of bladder control with increased duration seizures.  Scoliosis Negative. Pulmonary disease/dysfunction Positive for emphysema. Surgical history: Positive for abdominal hysterectomy with bladder sling..   Obstetrical History: G2P2 Deliveries: vaginal Tearing/Episiotomy: tearing (unsure  grade) Birthing position: back  Gynecological History: Hysterectomy: Yes Abdominal Endometriosis: Negative Pain with exam: No  Urinary History: Incontinence: Positive. Onset: last year Triggers: urge, minimal with coughing/laughing/sneezing. Amount: Min/Mod/Complete Loss.  Fluid Intake: 5-6x 16.9 oz H20, 1 cup coffee caffeinated, 2-3 16.9 oz Dr Malachi Bonds Nocturia: 4-5x/night; patient notes restlessness.  Frequency of urination: every 2 hours Pain with urination: Negative Difficulty initiating urination: Negative Intermittent stream: Negative. Frequent UTI: Negative.   Gastrointestinal History: [place] Stool  Chart: Type 4 Frequency of BMs: 1x/day AM Pain with defecation: Negative Straining with defecation: Negative Incontinence: Negative.   Sexual activity/pain: Pain with intercourse: Negative.   Initial penetration: No  Deep thrusting: No   Location of pain: N/A  Current activities:  Window shopping; watching videos; walking regularly.  Patient Goals:  "When I can stop peeing on myself" "When I get that sensation to use the bathroom"   OBJECTIVE  Mental Status Patient is oriented to person, place and time.  Recent memory is intact.  Remote memory is intact.  Attention span and concentration are intact.  Expressive speech is intact.  Patient's fund of knowledge is within normal limits for educational level.  POSTURE/OBSERVATIONS:  Patient sits with slumped posture and slight right lateral trunk flexion. Posterior pelvic tilt.   GAIT: Grossly WNL, slight RLE ER throughout  RANGE OF MOTION:    LEFT RIGHT  Lumbar forward flexion (65):  WNL    Lumbar extension (30): WNL    Lumbar lateral flexion (25):  WNL WNL  Thoracic and Lumbar rotation (30 degrees):    WNL WNL    STRENGTH: MMT   RLE LLE  Hip Flexion 5 5  Hip Abduction  5 5  Hip Adduction  5 5  Hip ER  5 5  Hip IR  5 5  Knee Extension 5 5  Knee Flexion 5 5  Dorsiflexion  5 5  Plantarflexion (seated) 5 5   ABDOMINAL:  Palpation: no TTP, RLQ hernia Diastasis: present throughout, > 3 finger width Rib flare: not noted  PHYSICAL PERFORMANCE MEASURES: STS: WNL  EXTERNAL PELVIC EXAM: Patient educated on the purpose of the pelvic exam and articulated understanding; patient consented to the exam verbally. Breath coordination: minimally and inconsistently present Cued Lengthen: significant abdominal compensations and Valsalva with negligible perineal descent Cued Contraction: significant gluteal, lumbar, and hip adductor compensations with minimal perineal ascent. Able to isolate PFM for likely 1/5 MMT perineal  ascent Cough: perineal ascent  INTERNAL VAGINAL EXAM: deferred 2/2 to time constraints Introitus Appears:  Skin integrity:  Scar mobility: Strength (PERF):  Symmetry: Palpation: Prolapse:  INTERNAL RECTAL EXAM: not indicated Strength (PERF): Symmetry: Palpation: Prolapse:   OUTCOME MEASURES: FOTO Urinary 34 ; PFDI 100   ASSESSMENT Patient is a 45 year old presenting to clinic with chief complaints of urinary incontinence. Upon examination, patient demonstrates deficits in PFM coordination, PFM strength, IAP management, and posture as evidenced by urinary incontinence with urge and stress components, significant compensations from abdominals, gluteals, and hip adductors with cued PFM activation, presence of an inguinal and hiatal hernia, use of breath holding to coordinate perineal descent, increased spinal flexion at rest. Patient's responses on FOTO outcome measures (PFDI Urinary 100; Urainry 34) indicate severe functional limitations/disability/distress. Patient's progress may be limited due to presence of comorbidities; however, patient's motivation is advantageous. Patient was able to achieve basic TrA activation and isolated PFM contraction during today's evaluation and responded positively to educational interventions. Patient will benefit from continued skilled therapeutic intervention to address  deficits in PFM coordination, PFM strength, IAP management, and posture in order to increase function and improve overall QOL.  EDUCATION Patient educated on prognosis, POC, and provided with HEP including: DRAM correction, quick flick. Patient articulated understanding and returned demonstration. Patient will benefit from further education in order to maximize compliance and understanding for long-term therapeutic gains.  Objective measurements completed on examination: See above findings.         PT Long Term Goals - 01/02/21 1019       PT LONG TERM GOAL #1   Title Patient  will demonstrate independence with HEP in order to maximize therapeutic gains and improve carryover from physical therapy sessions to ADLs in the home and community.    Baseline IE: not demonstrated    Time 12    Period Weeks    Status New    Target Date 03/27/21      PT LONG TERM GOAL #2   Title Patient will demonstrate improved function as evidenced by a score of 52 on FOTO measure for full participation in activities at home and in the community.    Baseline IE:    Time 12    Period Weeks    Status New    Target Date 03/27/21      PT LONG TERM GOAL #3   Title Patient will demonstrate improved function as evidenced by a score of <65 on FOTO PFDI Urinary measure for full participation in activities at home and in the community.    Baseline IE:    Time 12    Period Weeks    Status New    Target Date 03/27/21      PT LONG TERM GOAL #4   Title Patient will demonstrate circumferential and sequential contraction of >4/5 MMT, > 6 sec hold x10 and 5 consecutive quick flicks with </= 10 min rest between testing bouts, and relaxation of the PFM coordinated with breath for improved management of intra-abdominal pressure and normal bowel and bladder function without the presence of pain nor incontinence in order to improve participation at home and in the community.    Baseline IE: not demonstrated    Time 12    Period Weeks    Status New    Target Date 03/27/21      PT LONG TERM GOAL #5   Title Patient will demonstrate coordinated lengthening and relaxation of PFM with diaphragmatic inhalation in order to decrease spasm and allow for unrestricted elimination of urine/feces for improved overall QOL.    Baseline IE: not demonstrated    Time 12    Period Weeks    Status New    Target Date 03/27/21                    Plan - 01/02/21 1019     Clinical Impression Statement Patient is a 45 year old presenting to clinic with chief complaints of urinary incontinence. Upon  examination, patient demonstrates deficits in PFM coordination, PFM strength, IAP management, and posture as evidenced by urinary incontinence with urge and stress components, significant compensations from abdominals, gluteals, and hip adductors with cued PFM activation, presence of an inguinal and hiatal hernia, use of breath holding to coordinate perineal descent, increased spinal flexion at rest. Patient's responses on FOTO outcome measures (PFDI Urinary 100; Urainry 34) indicate severe functional limitations/disability/distress. Patient's progress may be limited due to presence of comorbidities; however, patient's motivation is advantageous. Patient was able to achieve basic TrA activation and  isolated PFM contraction during today's evaluation and responded positively to educational interventions. Patient will benefit from continued skilled therapeutic intervention to address deficits in PFM coordination, PFM strength, IAP management, and posture in order to increase function and improve overall QOL.    Personal Factors and Comorbidities Behavior Pattern;Comorbidity 3+;Time since onset of injury/illness/exacerbation;Past/Current Experience;Age;Finances    Comorbidities anxiety, ataxia, COPD, fibromyalgia, depression, seizure disorder, GERD, HTN    Examination-Activity Limitations Lift;Squat;Stairs;Locomotion Level;Carry;Sleep    Examination-Participation Restrictions Interpersonal Relationship;Laundry;Meal Prep;Community Activity;Cleaning    Stability/Clinical Decision Making Evolving/Moderate complexity    Clinical Decision Making Moderate    Rehab Potential Fair    PT Frequency 1x / week    PT Duration 12 weeks    PT Treatment/Interventions ADLs/Self Care Home Management;Moist Heat;Cryotherapy;Electrical Stimulation;Taping;Manual techniques;Patient/family education;Therapeutic exercise;Neuromuscular re-education;Therapeutic activities;Scar mobilization;Orthotic Fit/Training    PT Next Visit Plan  deep core    PT Home Exercise Plan [id number]    Consulted and Agree with Plan of Care Patient             Patient will benefit from skilled therapeutic intervention in order to improve the following deficits and impairments:  Postural dysfunction, Decreased strength, Decreased coordination, Decreased activity tolerance, Improper body mechanics, Increased muscle spasms, Decreased endurance  Visit Diagnosis: Muscle weakness (generalized)  Other lack of coordination     Problem List There are no problems to display for this patient.   Myles Gip PT, DPT [id number]  01/02/2021, 1:18 PM  [place] [facility] [address] [place], [place], [address] Phone: [phone]   Fax:  [phone]  Name: DUSTINA RIVENBURG MRN: [id number] Date of Birth: October 06, 1975

## 2021-01-09 ENCOUNTER — Ambulatory Visit: Payer: Medicare HMO | Admitting: Physical Therapy

## 2021-01-15 ENCOUNTER — Ambulatory Visit: Payer: Medicare HMO | Admitting: Physical Therapy

## 2021-01-15 ENCOUNTER — Encounter: Payer: Self-pay | Admitting: Physical Therapy

## 2021-01-15 ENCOUNTER — Other Ambulatory Visit: Payer: Self-pay

## 2021-01-15 DIAGNOSIS — M6281 Muscle weakness (generalized): Secondary | ICD-10-CM | POA: Diagnosis not present

## 2021-01-15 DIAGNOSIS — R278 Other lack of coordination: Secondary | ICD-10-CM

## 2021-01-15 NOTE — Therapy (Signed)
[facility] [address] [place], [place], [address] Phone: [phone]   Fax:  [phone]  Physical Therapy Treatment  Patient Details  Name: Anna Ayala MRN: [id number] Date of Birth: [birth year] Referring Provider (PT): Sherlene Shams   Encounter Date: 01/15/2021   PT End of Session - 01/15/21 0858     Visit Number 2    Number of Visits 12    Date for PT Re-Evaluation 03/27/21    PT Start Time 0900    PT Stop Time 0940    PT Time Calculation (min) 40 min    Activity Tolerance Patient tolerated treatment well    Behavior During Therapy [facility] for tasks assessed/performed             Past Medical History:  Diagnosis Date   Aneurysm of anterior cerebral artery    Anxiety    Ataxia    COPD (chronic obstructive pulmonary disease) (HCC)    Depression    Dysarthria    Fibromyalgia    GERD (gastroesophageal reflux disease)    Head ache    Hypertension    Myalgia and myositis    Seizure ([place])    Stroke ([place])    Tobacco abuse     Past Surgical History:  Procedure Laterality Date   BRAIN SURGERY     ROBOTIC ASSISTED TOTAL HYSTERECTOMY WITH SACROCOLPOPEXY     supracervical hyst, with posterior repair    There were no vitals filed for this visit.   Subjective Assessment - 01/15/21 0903     Subjective Patient reports that she missed last week because of seizure activity. Patient notes COPD is acting up and she is having increased leakage as a result. Patient notes this leakage occurs even if she feels like her bladder is empty.    Patient is accompained by: --    Currently in Pain? No/denies            TREATMENT  Neuromuscular Re-education: Balance disc, seated postural control/awareness:  Pelvic tilts with coordinated breath,     Pelvic neutral marching with coordinated breath,     Pelvic neutral with UE challenge, RTB,  Standing TrA with hip flexion march at counter Standing TrA with UE reach  (modified bird dog)    Patient educated throughout session on appropriate technique and form using multi-modal cueing, HEP, and activity modification. Patient articulated understanding and returned demonstration.  Patient Response to interventions: Denies any questions  ASSESSMENT Patient presents to clinic with excellent motivation to participate in therapy. Patient demonstrates deficits in PFM coordination, PFM strength, IAP management, and posture. Patient able to achieve consistent TrA activation in seated and standing postures during today's session and responded positively to active interventions. Patient will benefit from continued skilled therapeutic intervention to address remaining deficits in PFM coordination, PFM strength, IAP management, and posture in order to increase function and improve overall QOL.     PT Long Term Goals - 01/02/21 1019       PT LONG TERM GOAL #1   Title Patient will demonstrate independence with HEP in order to maximize therapeutic gains and improve carryover from physical therapy sessions to ADLs in the home and community.    Baseline IE: not demonstrated    Time 12    Period Weeks    Status New    Target Date 03/27/21      PT LONG TERM GOAL #2   Title Patient will demonstrate improved function as evidenced by a  score of 52 on FOTO measure for full participation in activities at home and in the community.    Baseline IE:    Time 12    Period Weeks    Status New    Target Date 03/27/21      PT LONG TERM GOAL #3   Title Patient will demonstrate improved function as evidenced by a score of <65 on FOTO PFDI Urinary measure for full participation in activities at home and in the community.    Baseline IE:    Time 12    Period Weeks    Status New    Target Date 03/27/21      PT LONG TERM GOAL #4   Title Patient will demonstrate circumferential and sequential contraction of >4/5 MMT, > 6 sec hold x10 and 5 consecutive quick flicks with </= 10  min rest between testing bouts, and relaxation of the PFM coordinated with breath for improved management of intra-abdominal pressure and normal bowel and bladder function without the presence of pain nor incontinence in order to improve participation at home and in the community.    Baseline IE: not demonstrated    Time 12    Period Weeks    Status New    Target Date 03/27/21      PT LONG TERM GOAL #5   Title Patient will demonstrate coordinated lengthening and relaxation of PFM with diaphragmatic inhalation in order to decrease spasm and allow for unrestricted elimination of urine/feces for improved overall QOL.    Baseline IE: not demonstrated    Time 12    Period Weeks    Status New    Target Date 03/27/21                   Plan - 01/15/21 0858     Clinical Impression Statement Patient presents to clinic with excellent motivation to participate in therapy. Patient demonstrates deficits in PFM coordination, PFM strength, IAP management, and posture. Patient able to achieve consistent TrA activation in seated and standing postures during today's session and responded positively to active interventions. Patient will benefit from continued skilled therapeutic intervention to address remaining deficits in PFM coordination, PFM strength, IAP management, and posture in order to increase function and improve overall QOL.    Personal Factors and Comorbidities Behavior Pattern;Comorbidity 3+;Time since onset of injury/illness/exacerbation;Past/Current Experience;Age;Finances    Comorbidities anxiety, ataxia, COPD, fibromyalgia, depression, seizure disorder, GERD, HTN    Examination-Activity Limitations Lift;Squat;Stairs;Locomotion Level;Carry;Sleep    Examination-Participation Restrictions Interpersonal Relationship;Laundry;Meal Prep;Community Activity;Cleaning    Stability/Clinical Decision Making Evolving/Moderate complexity    Rehab Potential Fair    PT Frequency 1x / week    PT  Duration 12 weeks    PT Treatment/Interventions ADLs/Self Care Home Management;Moist Heat;Cryotherapy;Electrical Stimulation;Taping;Manual techniques;Patient/family education;Therapeutic exercise;Neuromuscular re-education;Therapeutic activities;Scar mobilization;Orthotic Fit/Training    PT Next Visit Plan deep core    PT Home Exercise Plan [id number]    Consulted and Agree with Plan of Care Patient             Patient will benefit from skilled therapeutic intervention in order to improve the following deficits and impairments:  Postural dysfunction, Decreased strength, Decreased coordination, Decreased activity tolerance, Improper body mechanics, Increased muscle spasms, Decreased endurance  Visit Diagnosis: Muscle weakness (generalized)  Other lack of coordination     Problem List There are no problems to display for this patient.   Sheria Lang PT, DPT [phone]  01/15/2021, 5:29 PM  [facility]  [facility] [address] [place], [place], [address] Phone: [phone]   Fax:  [phone]  Name: Anna Ayala MRN: [id number] Date of Birth: [birth year]

## 2021-01-23 ENCOUNTER — Ambulatory Visit: Payer: Medicare HMO | Admitting: Physical Therapy

## 2021-01-30 ENCOUNTER — Ambulatory Visit: Payer: Medicare HMO | Admitting: Physical Therapy

## 2021-02-06 ENCOUNTER — Ambulatory Visit: Payer: Medicare HMO | Attending: Obstetrics and Gynecology | Admitting: Physical Therapy

## 2021-02-06 DIAGNOSIS — M6281 Muscle weakness (generalized): Secondary | ICD-10-CM | POA: Insufficient documentation

## 2021-02-06 DIAGNOSIS — R278 Other lack of coordination: Secondary | ICD-10-CM | POA: Insufficient documentation

## 2021-02-12 ENCOUNTER — Encounter: Payer: Self-pay | Admitting: Obstetrics and Gynecology

## 2021-02-12 ENCOUNTER — Other Ambulatory Visit: Payer: Self-pay

## 2021-02-12 ENCOUNTER — Ambulatory Visit (INDEPENDENT_AMBULATORY_CARE_PROVIDER_SITE_OTHER): Payer: Medicare HMO | Admitting: Obstetrics and Gynecology

## 2021-02-12 VITALS — BP 120/78 | HR 76

## 2021-02-12 DIAGNOSIS — N3281 Overactive bladder: Secondary | ICD-10-CM | POA: Diagnosis not present

## 2021-02-12 DIAGNOSIS — N8110 Cystocele, unspecified: Secondary | ICD-10-CM | POA: Diagnosis not present

## 2021-02-12 NOTE — Progress Notes (Signed)
[facility] Return Visit  SUBJECTIVE  History of Present Illness: Anna Ayala is a 45 y.o. female seen in follow-up for OAB and SUI. Plan at last visit was to decrease bladder irritants (coffee, Dr Reino Kent, sweet tea) and start physical therapy.   Now drinking one cup coffee in AM and still having Dr Reino Kent during the day. Feels she needs to urinate a lot more if she has water. PT has been going well so far, has been helping. She has been feeling more vaginal pressure with her prolapse.   - 11/08/17 s/p Robotic supracervical hysterectomy, bilateral salpingectomy, mesh sacrocolpopexy, cystoscopy - 01/2018- posterior repair  Past Medical History: Patient  has a past medical history of Aneurysm of anterior cerebral artery, Anxiety, Ataxia, COPD (chronic obstructive pulmonary disease) (HCC), Depression, Dysarthria, Fibromyalgia, GERD (gastroesophageal reflux disease), Head ache, Hypertension, Myalgia and myositis, Seizure (HCC), Stroke (HCC), and Tobacco abuse.   Past Surgical History: She  has a past surgical history that includes Brain surgery and Robotic assisted total hysterectomy with sacrocolpopexy.   Medications: She has a current medication list which includes the following prescription(s): albuterol, albuterol, aspirin, cetirizine, fluticasone, furosemide, gabapentin, hydroxyzine, ketoconazole, lamotrigine, methocarbamol, pantoprazole, pregabalin, promethazine-dextromethorphan, propranolol, quetiapine, sertraline, and tizanidine.   Allergies: Patient is allergic to bee venom, chantix [varenicline], spiriva handihaler [tiotropium bromide monohydrate], tramadol, and diclofenac.   Social History: Patient  reports that she has been smoking cigarettes. She has never used smokeless tobacco. She reports that she does not currently use drugs. She reports that she does not drink alcohol.      OBJECTIVE     Physical Exam: Vitals:   02/12/21 1025  BP: 120/78  Pulse: 76    Gen: No apparent distress, A&O x 3.  Detailed Urogynecologic Evaluation:  Deferred.    ASSESSMENT AND PLAN    Ms. Masin is a 45 y.o. with:  1. Overactive bladder   2. Prolapse of anterior vaginal wall    OAB - Again reviewed decreasing coffee and soda intake. She will work on decreasing Dr Reino Kent.  - She declines medications at this time. Will continue with PT.  - Reviewed option of PTNS and handout provided, as an alternative to medication.   2. Anterior vaginal prolapse - Discussed option of pessary. She would like to see how PT goes for her.   Follow up 4 months or sooner if needed  Marguerita Beards, MD  Time spent: I spent 15 minutes dedicated to the care of this patient on the date of this encounter to include pre-visit review of records, face-to-face time with the patient and post visit documentation.

## 2021-02-13 ENCOUNTER — Ambulatory Visit: Payer: Medicare HMO | Admitting: Physical Therapy

## 2021-02-13 ENCOUNTER — Encounter: Payer: Self-pay | Admitting: Physical Therapy

## 2021-02-13 DIAGNOSIS — R278 Other lack of coordination: Secondary | ICD-10-CM | POA: Diagnosis present

## 2021-02-13 DIAGNOSIS — M6281 Muscle weakness (generalized): Secondary | ICD-10-CM

## 2021-02-13 NOTE — Therapy (Signed)
[facility] [address] [place], [place], [address] Phone: [phone]   Fax:  [phone]  Physical Therapy Treatment  Patient Details  Name: Anna Ayala MRN: [id number] Date of Birth: [birth year] Referring Provider (PT): Lanetta Inch   Encounter Date: 02/13/2021   PT End of Session - 02/13/21 1140     Visit Number 3    Number of Visits 12    Date for PT Re-Evaluation 03/27/21    PT Start Time 1030    PT Stop Time 1115    PT Time Calculation (min) 45 min    Activity Tolerance Patient tolerated treatment well    Behavior During Therapy [facility] for tasks assessed/performed             Past Medical History:  Diagnosis Date   Aneurysm of anterior cerebral artery    Anxiety    Ataxia    COPD (chronic obstructive pulmonary disease) (HCC)    Depression    Dysarthria    Fibromyalgia    GERD (gastroesophageal reflux disease)    Head ache    Hypertension    Myalgia and myositis    Seizure (HCC)    Stroke (HCC)    Tobacco abuse     Past Surgical History:  Procedure Laterality Date   BRAIN SURGERY     ROBOTIC ASSISTED TOTAL HYSTERECTOMY WITH SACROCOLPOPEXY     supracervical hyst, with posterior repair    There were no vitals filed for this visit.   Subjective Assessment - 02/13/21 1135     Subjective Patient notes she had follow up with uroygn this week and reports that she wants to continue to give PFPT her attention before trialing anything more invasive. Patient did mention curiousity with PTNS as a treatment intervention, and was open to trialing PTNS via electrode patches as opposed to needles.    Currently in Pain? No/denies             TREATMENT  Neuromuscular Re-education: Patient educated on posterior tibial nerve anatomy and its relationship to the PFM. Anatomical images provided for improved understanding. Seated postural stabilization and DRAM corrective exercises with green  theraband:  Serve a tray  Hug a tree  PNF push/pull  UE reach Patient and spouse educated on EMPI set up and electrode placement for trial of home use.    Treatment unbilled: PTNS, 150 pps, duration 30 minutes (during neuromuscular re-education), 37.31mV. No skin irritation or adverse reaction when electrodes removed. Handout provided for home use. Patient and spouse educated on signs/symptoms that would indicate discontinuing treatment.  Patient educated throughout session on appropriate technique and form using multi-modal cueing, HEP, and activity modification. Patient articulated understanding and returned demonstration.  Patient Response to interventions: Denies any questions  ASSESSMENT Patient presents to clinic with excellent motivation to participate in therapy. Patient demonstrates deficits in PFM coordination, PFM strength, IAP management, and posture. Patient with improved postural stability and coordinated breath pattern when performing seated activities against resistance during today's session and responded positively to PTNS trial. Patient will benefit from continued skilled therapeutic intervention to address remaining deficits in PFM coordination, PFM strength, IAP management, and posture in order to increase function and improve overall QOL.   PT Long Term Goals - 01/02/21 1019       PT LONG TERM GOAL #1   Title Patient will demonstrate independence with HEP in order to maximize therapeutic gains and improve carryover from physical therapy sessions to ADLs in the  home and community.    Baseline IE: not demonstrated    Time 12    Period Weeks    Status New    Target Date 03/27/21      PT LONG TERM GOAL #2   Title Patient will demonstrate improved function as evidenced by a score of 52 on FOTO measure for full participation in activities at home and in the community.    Baseline IE:    Time 12    Period Weeks    Status New    Target Date 03/27/21      PT LONG TERM  GOAL #3   Title Patient will demonstrate improved function as evidenced by a score of <65 on FOTO PFDI Urinary measure for full participation in activities at home and in the community.    Baseline IE:    Time 12    Period Weeks    Status New    Target Date 03/27/21      PT LONG TERM GOAL #4   Title Patient will demonstrate circumferential and sequential contraction of >4/5 MMT, > 6 sec hold x10 and 5 consecutive quick flicks with </= 10 min rest between testing bouts, and relaxation of the PFM coordinated with breath for improved management of intra-abdominal pressure and normal bowel and bladder function without the presence of pain nor incontinence in order to improve participation at home and in the community.    Baseline IE: not demonstrated    Time 12    Period Weeks    Status New    Target Date 03/27/21      PT LONG TERM GOAL #5   Title Patient will demonstrate coordinated lengthening and relaxation of PFM with diaphragmatic inhalation in order to decrease spasm and allow for unrestricted elimination of urine/feces for improved overall QOL.    Baseline IE: not demonstrated    Time 12    Period Weeks    Status New    Target Date 03/27/21                   Plan - 02/13/21 1140     Clinical Impression Statement Patient presents to clinic with excellent motivation to participate in therapy. Patient demonstrates deficits in PFM coordination, PFM strength, IAP management, and posture. Patient with improved postural stability and coordinated breath pattern when performing seated activities against resistance during today's session and responded positively to PTNS trial. Patient will benefit from continued skilled therapeutic intervention to address remaining deficits in PFM coordination, PFM strength, IAP management, and posture in order to increase function and improve overall QOL.    Personal Factors and Comorbidities Behavior Pattern;Comorbidity 3+;Time since onset of  injury/illness/exacerbation;Past/Current Experience;Age;Finances    Comorbidities anxiety, ataxia, COPD, fibromyalgia, depression, seizure disorder, GERD, HTN    Examination-Activity Limitations Lift;Squat;Stairs;Locomotion Level;Carry;Sleep    Examination-Participation Restrictions Interpersonal Relationship;Laundry;Meal Prep;Community Activity;Cleaning    Stability/Clinical Decision Making Evolving/Moderate complexity    Rehab Potential Fair    PT Frequency 1x / week    PT Duration 12 weeks    PT Treatment/Interventions ADLs/Self Care Home Management;Moist Heat;Cryotherapy;Electrical Stimulation;Taping;Manual techniques;Patient/family education;Therapeutic exercise;Neuromuscular re-education;Therapeutic activities;Scar mobilization;Orthotic Fit/Training    PT Next Visit Plan goals; PFM strengthening    PT Home Exercise Plan [id number]    Consulted and Agree with Plan of Care Patient             Patient will benefit from skilled therapeutic intervention in order to improve the following deficits and impairments:  Postural dysfunction, Decreased strength,  Decreased coordination, Decreased activity tolerance, Improper body mechanics, Increased muscle spasms, Decreased endurance  Visit Diagnosis: Muscle weakness (generalized)  Other lack of coordination     Problem List There are no problems to display for this patient.   Sheria Lang PT, DPT [phone]  02/13/2021, 11:56 AM  [place] [facility] [address] [place], [place], [address] Phone: [phone]   Fax:  [phone]  Name: RICKY DOAN MRN: [id number] Date of Birth: Oct 12, 1975

## 2021-02-20 ENCOUNTER — Encounter: Payer: Medicare HMO | Admitting: Physical Therapy

## 2021-02-27 ENCOUNTER — Encounter: Payer: Medicare HMO | Admitting: Physical Therapy

## 2021-03-20 ENCOUNTER — Encounter: Payer: Self-pay | Admitting: Physical Therapy

## 2021-03-20 ENCOUNTER — Ambulatory Visit: Payer: Medicare HMO | Attending: Obstetrics and Gynecology | Admitting: Physical Therapy

## 2021-03-20 ENCOUNTER — Other Ambulatory Visit: Payer: Self-pay

## 2021-03-20 DIAGNOSIS — R278 Other lack of coordination: Secondary | ICD-10-CM | POA: Diagnosis present

## 2021-03-20 DIAGNOSIS — M6281 Muscle weakness (generalized): Secondary | ICD-10-CM | POA: Insufficient documentation

## 2021-03-20 NOTE — Therapy (Signed)
[facility] [address] [place], [place], [address] Phone: [phone]   Fax:  [phone]  Physical Therapy Treatment  Patient Details  Name: Anna Ayala MRN: [id number] Date of Birth: [birth year] Referring Provider (PT): Sherlene Shams   Encounter Date: 03/20/2021   PT End of Session - 03/20/21 1025     Visit Number 4    Number of Visits 12    Date for PT Re-Evaluation 03/27/21    PT Start Time [id number]    PT Stop Time 1100    PT Time Calculation (min) 45 min    Activity Tolerance Patient tolerated treatment well    Behavior During Therapy [facility] for tasks assessed/performed             Past Medical History:  Diagnosis Date   Aneurysm of anterior cerebral artery    Anxiety    Ataxia    COPD (chronic obstructive pulmonary disease) ([place])    Depression    Dysarthria    Fibromyalgia    GERD (gastroesophageal reflux disease)    Head ache    Hypertension    Myalgia and myositis    Seizure ([place])    Stroke ([place])    Tobacco abuse     Past Surgical History:  Procedure Laterality Date   BRAIN SURGERY     ROBOTIC ASSISTED TOTAL HYSTERECTOMY WITH SACROCOLPOPEXY     supracervical hyst, with posterior repair    There were no vitals filed for this visit.   Subjective Assessment - 03/20/21 1023     Subjective Patient notes she had a really rough time with pneumonia. Patient tried using PTNS and was concerned her setup was incorrect. She would like more guidance on setup today. She does add that with recent bout of penumonia, while she did have leakage it was not as severe as it has been in the past.    Currently in Pain? No/denies            Neuromuscular Re-education: Seated postural stabilization and DRAM corrective exercises with green theraband:  Serve a tray  Hug a tree  PNF push/pull  UE reach Patient educated on EMPI set up and practice multiple times with faded cueing and guidance; additional notes made  on handout given last visit for improved independence with home use.    Patient educated throughout session on appropriate technique and form using multi-modal cueing, HEP, and activity modification. Patient articulated understanding and returned demonstration.  Patient Response to interventions: Denies any questions; notes better confidence with EMPI set up.   ASSESSMENT Patient presents to clinic with excellent motivation to participate in therapy. Patient demonstrates deficits in PFM coordination, PFM strength, IAP management, and posture. Patient subjectively noting improvement in symptoms but not demonstrated on outcome measures collected during today's session and responded positively to educational and active interventions. Patient will benefit from continued skilled therapeutic intervention to address remaining deficits in PFM coordination, PFM strength, IAP management, and posture in order to increase function and improve overall QOL.      PT Long Term Goals - 03/20/21 1026       PT LONG TERM GOAL #1   Title Patient will demonstrate independence with HEP in order to maximize therapeutic gains and improve carryover from physical therapy sessions to ADLs in the home and community.    Baseline IE: not demonstrated; 1/26: IND    Time 12    Period Weeks    Status Achieved    Target  Date 03/27/21      PT LONG TERM GOAL #2   Title Patient will demonstrate improved function as evidenced by a score of 52 on FOTO measure for full participation in activities at home and in the community.    Baseline IE: 34; 1/26: 30    Time 12    Period Weeks    Status On-going    Target Date 03/27/21      PT LONG TERM GOAL #3   Title Patient will demonstrate improved function as evidenced by a score of <65 on FOTO PFDI Urinary measure for full participation in activities at home and in the community.    Baseline IE: 100; 1/26: 88    Time 12    Period Weeks    Status On-going    Target Date  03/27/21      PT LONG TERM GOAL #4   Title Patient will demonstrate circumferential and sequential contraction of >4/5 MMT, > 6 sec hold x10 and 5 consecutive quick flicks with </= 10 min rest between testing bouts, and relaxation of the PFM coordinated with breath for improved management of intra-abdominal pressure and normal bowel and bladder function without the presence of pain nor incontinence in order to improve participation at home and in the community.    Baseline IE: not demonstrated    Time 12    Period Weeks    Status New    Target Date 03/27/21      PT LONG TERM GOAL #5   Title Patient will demonstrate coordinated lengthening and relaxation of PFM with diaphragmatic inhalation in order to decrease spasm and allow for unrestricted elimination of urine/feces for improved overall QOL.    Baseline IE: not demonstrated    Time 12    Period Weeks    Status New    Target Date 03/27/21                   Plan - 03/20/21 1025     Clinical Impression Statement Patient presents to clinic with excellent motivation to participate in therapy. Patient demonstrates deficits in PFM coordination, PFM strength, IAP management, and posture. Patient subjectively noting improvement in symptoms but not demonstrated on outcome measures collected during today's session and responded positively to educational and active interventions. Patient will benefit from continued skilled therapeutic intervention to address remaining deficits in PFM coordination, PFM strength, IAP management, and posture in order to increase function and improve overall QOL.    Personal Factors and Comorbidities Behavior Pattern;Comorbidity 3+;Time since onset of injury/illness/exacerbation;Past/Current Experience;Age;Finances    Comorbidities anxiety, ataxia, COPD, fibromyalgia, depression, seizure disorder, GERD, HTN    Examination-Activity Limitations Lift;Squat;Stairs;Locomotion Level;Carry;Sleep     Examination-Participation Restrictions Interpersonal Relationship;Laundry;Meal Prep;Community Activity;Cleaning    Stability/Clinical Decision Making Evolving/Moderate complexity    Rehab Potential Fair    PT Frequency 1x / week    PT Duration 12 weeks    PT Treatment/Interventions ADLs/Self Care Home Management;Moist Heat;Cryotherapy;Electrical Stimulation;Taping;Manual techniques;Patient/family education;Therapeutic exercise;Neuromuscular re-education;Therapeutic activities;Scar mobilization;Orthotic Fit/Training    PT Next Visit Plan PFM strengthening    PT Home Exercise Plan [id number]    Consulted and Agree with Plan of Care Patient             Patient will benefit from skilled therapeutic intervention in order to improve the following deficits and impairments:  Postural dysfunction, Decreased strength, Decreased coordination, Decreased activity tolerance, Improper body mechanics, Increased muscle spasms, Decreased endurance  Visit Diagnosis: Muscle weakness (generalized)  Other lack of coordination  Problem List There are no problems to display for this patient.   Myles Gip PT, DPT [phone]  03/20/2021, 11:01 AM  [facility] [address] [place], [place], [address] Phone: [phone]   Fax:  [phone]  Name: Anna Ayala MRN: [id number] Date of Birth: 1976/01/01

## 2021-03-28 ENCOUNTER — Ambulatory Visit
Admission: EM | Admit: 2021-03-28 | Discharge: 2021-03-28 | Disposition: A | Discharge status: Home / Self Care | Payer: Medicare HMO | Attending: Emergency Medicine | Admitting: Emergency Medicine

## 2021-03-28 ENCOUNTER — Other Ambulatory Visit: Payer: Self-pay

## 2021-03-28 DIAGNOSIS — B9689 Other specified bacterial agents as the cause of diseases classified elsewhere: Secondary | ICD-10-CM | POA: Diagnosis present

## 2021-03-28 DIAGNOSIS — N760 Acute vaginitis: Secondary | ICD-10-CM | POA: Insufficient documentation

## 2021-03-28 LAB — WET PREP, GENITAL
Sperm: NONE SEEN
Trich, Wet Prep: NONE SEEN
WBC, Wet Prep HPF POC: <10 (ref <10)
Yeast Wet Prep HPF POC: NONE SEEN

## 2021-03-28 LAB — URINALYSIS, MICROSCOPIC (REFLEX)

## 2021-03-28 LAB — URINALYSIS, ROUTINE W REFLEX MICROSCOPIC
Glucose, UA: NEGATIVE mg/dL
Ketones, ur: NEGATIVE mg/dL
Nitrite: NEGATIVE
Protein, ur: >300 mg/dL — AB
Specific Gravity, Urine: 1.025 (ref 1.005–1.030)
pH: 7 (ref 5.0–8.0)

## 2021-03-28 MED ORDER — METRONIDAZOLE 500 MG PO TABS: 500.0000 mg | ORAL_TABLET | Freq: Two times a day (BID) | ORAL | 0 refills | Status: DC

## 2021-03-28 NOTE — ED Provider Notes (Signed)
[facility]    CSN: [id number] Arrival date & time: 03/28/21  1519      History   Chief Complaint Chief Complaint  Patient presents with   Urinary pain    HPI Anna Ayala is a 46 y.o. female.   Patient presents with pain after urination, urinary frequency, hematuria and lower abdominal pressure for 1 week.  Endorses that symptoms worsen 4 days ago.  Has attempted use of Tylenol which has not been helpful.  Denies vaginal discharge, itching, irritation, urgency, flank pain, fever, chills.  Endorses history of kidney stones seen on imaging without complication.    Past Medical History:  Diagnosis Date   Aneurysm of anterior cerebral artery    Anxiety    Ataxia    COPD (chronic obstructive pulmonary disease) ([facility])    Depression    Dysarthria    Fibromyalgia    GERD (gastroesophageal reflux disease)    Head ache    Hypertension    Myalgia and myositis    Seizure ([place])    Stroke ([facility])    Tobacco abuse     There are no problems to display for this patient.   Past Surgical History:  Procedure Laterality Date   BRAIN SURGERY     ROBOTIC ASSISTED TOTAL HYSTERECTOMY WITH SACROCOLPOPEXY     supracervical hyst, with posterior repair    OB History     Gravida  4   Para      Term      Preterm      AB  2   Living  2      SAB  2   IAB      Ectopic      Multiple      Live Births  2            Home Medications    Prior to Admission medications   Medication Sig Start Date End Date Taking? Authorizing Provider  albuterol (PROVENTIL) (2.5 MG/3ML) 0.083% nebulizer solution Take 2.5 mg by nebulization every 6 (six) hours as needed for wheezing or shortness of breath.   Yes [provider]  albuterol (VENTOLIN HFA) 108 (90 Base) MCG/ACT inhaler Inhale into the lungs every 6 (six) hours as needed for wheezing or shortness of breath.   Yes [provider]  aspirin 325 MG tablet Take 325 mg by mouth daily.   Yes [provider]  cetirizine (ZYRTEC) 10 MG tablet Take 10 mg by mouth daily.   Yes [provider]  fluticasone (FLOVENT HFA) 110 MCG/ACT inhaler Inhale into the lungs 2 (two) times daily.   Yes [provider]  furosemide (LASIX) 40 MG tablet Take 40 mg by mouth.   Yes [provider]  gabapentin (NEURONTIN) 300 MG capsule Take 300 mg by mouth 3 (three) times daily.   Yes [provider]  hydrOXYzine (ATARAX/VISTARIL) 25 MG tablet Take 25 mg by mouth 2 (two) times daily.   Yes [provider]  ketoconazole (NIZORAL) 2 % cream Apply 1 application topically daily.   Yes [provider]  lamoTRIgine (LAMICTAL) 25 MG tablet Take by mouth. 08/03/20  Yes [provider]  methocarbamol (ROBAXIN) 500 MG tablet Take 1-2 tablets by mouth 3 (three) times daily as needed. 06/23/20  Yes [provider]  pantoprazole (PROTONIX) 40 MG tablet Take 40 mg by mouth daily.   Yes [provider]  pregabalin (LYRICA) 100 MG capsule Take 100 mg by mouth 2 (two)  times daily.   Yes [provider]  promethazine-dextromethorphan (PROMETHAZINE-DM) 6.25-15 MG/5ML syrup Take 5 mLs by mouth 4 (four) times daily as needed. 08/10/20  Yes Margarette Canada, NP  propranolol (INDERAL) 20 MG tablet Take 20 mg by mouth daily.   Yes [provider]  QUEtiapine (SEROQUEL) 50 MG tablet SMARTSIG:4 Tablet(s) By Mouth Every Night PRN 08/03/20  Yes [provider]  sertraline (ZOLOFT) 25 MG tablet Take 25 mg by mouth daily.   Yes [provider]  tiZANidine (ZANAFLEX) 2 MG tablet Take 2 mg by mouth once.   Yes [provider]    Family History Family History  Adopted: Yes    Social History Social History   Tobacco Use   Smoking status: Every Day    Types: Cigarettes   Smokeless tobacco: Never  Vaping Use   Vaping Use: Never used  Substance Use Topics   Alcohol use: No   Drug use: Not Currently     Allergies    Bee venom, Chantix [varenicline], Spiriva handihaler [tiotropium bromide monohydrate], Tramadol, and Diclofenac   Review of Systems Review of Systems  Constitutional: Negative.  Negative for activity change.  Respiratory: Negative.    Cardiovascular: Negative.   Gastrointestinal:  Positive for abdominal pain. Negative for abdominal distention, anal bleeding, blood in stool, constipation, diarrhea, nausea, rectal pain and vomiting.  Genitourinary:  Positive for dysuria, frequency and hematuria. Negative for decreased urine volume, difficulty urinating, dyspareunia, enuresis, flank pain, genital sores, menstrual problem, pelvic pain, urgency, vaginal bleeding, vaginal discharge and vaginal pain.  Skin: Negative.   Neurological: Negative.     Physical Exam Triage Vital Signs ED Triage Vitals  Enc Vitals Group     BP 03/28/21 1540 137/83     Pulse Rate 03/28/21 1540 98     Resp 03/28/21 1540 18     Temp --      Temp src --      SpO2 03/28/21 1540 93 %     Weight 03/28/21 1537 209 lb (94.8 kg)     Height 03/28/21 1537 5\' 9"  (1.753 m)     Head Circumference --      Peak Flow --      Pain Score 03/28/21 1537 10     Pain Loc --      Pain Edu? --      Excl. in [place]? --    No data found.  Updated Vital Signs BP 137/83    Pulse 98    Resp 18    Ht 5\' 9"  (1.753 m)    Wt 209 lb (94.8 kg)    LMP 09/16/2016    SpO2 93%    BMI 30.86 kg/m   Visual Acuity Right Eye Distance:   Left Eye Distance:   Bilateral Distance:    Right Eye Near:   Left Eye Near:    Bilateral Near:     Physical Exam Constitutional:      Appearance: Normal appearance.  Eyes:     Extraocular Movements: Extraocular movements intact.  Pulmonary:     Effort: Pulmonary effort is normal.  Abdominal:     General: Abdomen is flat.     Palpations: Abdomen is soft.     Tenderness: There is abdominal tenderness in the suprapubic area. There is no right CVA tenderness or left CVA tenderness.  Skin:    General: Skin  is warm and dry.  Neurological:     Mental Status: She is alert and oriented to  person, place, and time. Mental status is at baseline.  Psychiatric:        Mood and Affect: Mood normal.        Behavior: Behavior normal.     UC Treatments / Results  Labs (all labs ordered are listed, but only abnormal results are displayed) Labs Reviewed  URINALYSIS, [place] MICROSCOPIC    EKG   Radiology No results found.  Procedures Procedures (including critical care time)  Medications Ordered in UC Medications - No data to display  Initial Impression / Assessment and Plan / UC Course  I have reviewed the triage vital signs and the nursing notes.  Pertinent labs & imaging results that were available during my care of the patient were reviewed by me and considered in my medical decision making (see chart for details).  Clinical Course as of 03/28/21 1824  Fri Mar 28, 2021  1626 Bacteria, UA(!): FEW [AW]    Clinical Course User Index [AW] Hans Eden, NP   Bacterial vaginosis  Confirmed by wet prep, negative for yeast and trichomoniasis, urinalysis negative for bladder infection, discussed findings with the patient, metronidazole 7-day course prescribed, advise discontinuation of any alcohol use while medication, advised abstinence until symptoms have resolved and treatment is complete, may follow-up with urgent care as needed Final Clinical Impressions(s) / UC Diagnoses   Final diagnoses:  None   Discharge Instructions   None    ED Prescriptions   None    PDMP not reviewed this encounter.   Hans Eden, NP 03/28/21 [phone]

## 2021-03-28 NOTE — ED Triage Notes (Signed)
Pt states that after she urinates, she experiences bladder pain and has blood when wiping starting on Monday. Pt states that she has kidney stones and gall stones but they have never passed.  Pt states that the pain is only present after she urinates.   Pt had total reconstruction surgery twice and a total hysterectomy.   Pt states that she sees a lung doctor for her oxygen as it runs low

## 2021-03-28 NOTE — Discharge Instructions (Addendum)
Today you are being treated for  Bacterial vaginosis   Urinalysis negative, vaginal swab positive for bacterial vaginosis, negative for yeast, trichomoniasis    Take Metronidazole 500 mg twice a day for 7 days, do not drink alcohol while using medication, this will make you feel sick   Bacterial vaginosis which results from an overgrowth of one on several organisms that are normally present in your vagina. Vaginosis is an inflammation of the vagina that can result in discharge, itching and pain.   Moving forward, it is recommended you use some form of protection against the transmission of sti infections  such as condoms or dental dams with each sexual encounter     In addition: Avoid baths, hot tubs and whirlpool spas.  Don't use scented or harsh soaps Avoid irritants. These include scented tampons and pads. Wipe from front to back after using the toilet. Don't douche. Your vagina doesn't require cleansing other than normal bathing.  Use a condom.  Wear cotton underwear, this fabric absorbs some moisture.

## 2021-04-03 ENCOUNTER — Ambulatory Visit: Payer: Medicare HMO | Attending: Obstetrics and Gynecology | Admitting: Physical Therapy

## 2021-04-03 ENCOUNTER — Encounter: Payer: Self-pay | Admitting: Physical Therapy

## 2021-04-03 ENCOUNTER — Other Ambulatory Visit: Payer: Self-pay

## 2021-04-03 DIAGNOSIS — M6281 Muscle weakness (generalized): Secondary | ICD-10-CM | POA: Diagnosis not present

## 2021-04-03 DIAGNOSIS — R278 Other lack of coordination: Secondary | ICD-10-CM | POA: Insufficient documentation

## 2021-04-03 NOTE — Therapy (Signed)
[facility] [address]. [place], [place], [address] Phone: [phone]   Fax:  [phone]  Physical Therapy Treatment  Patient Details  Name: Anna Ayala MRN: [id number] Date of Birth: November 22, 1975 Referring Provider (PT): Sherlene Shams   Encounter Date: 04/03/2021   PT End of Session - 04/03/21 0954     Visit Number 5    Number of Visits 12    Date for PT Re-Evaluation 03/27/21    PT Start Time 0945    PT Stop Time 1025    PT Time Calculation (min) 40 min    Activity Tolerance Patient tolerated treatment well    Behavior During Therapy [facility] for tasks assessed/performed             Past Medical History:  Diagnosis Date   Aneurysm of anterior cerebral artery    Anxiety    Ataxia    COPD (chronic obstructive pulmonary disease) ([place])    Depression    Dysarthria    Fibromyalgia    GERD (gastroesophageal reflux disease)    Head ache    Hypertension    Myalgia and myositis    Seizure ([place])    Stroke ([place])    Tobacco abuse     Past Surgical History:  Procedure Laterality Date   BRAIN SURGERY     ROBOTIC ASSISTED TOTAL HYSTERECTOMY WITH SACROCOLPOPEXY     supracervical hyst, with posterior repair    There were no vitals filed for this visit.   Subjective Assessment - 04/03/21 0951     Subjective Patient reports that she is in pain from infection. She did finish course of antibiotics and notes continued pain after urination. Patient also has had onset of vaginal bleeding. Notes spouse did look at tissue and did not see any tears/breakdown. Patient feels PTNS home unit has been helpful and is continuing with exercises.    Currently in Pain? No/denies            TREATMENT Neuromuscular Re-education: Reassessed goals; see below.    Patient educated throughout session on appropriate technique and form using multi-modal cueing, HEP, and activity modification. Patient articulated understanding and returned  demonstration.  Patient Response to interventions: Comfortable to f/u in 2 weeks.   ASSESSMENT Patient presents to clinic with excellent motivation to participate in therapy. Patient demonstrates deficits in PFM coordination, PFM strength, IAP management, and posture. Patient with improved PFM strength with palpable squeeze and lift for 5 repetitions (in the presence of significant cocontractions) during today's session and has responded positively to active interventions. Patient has made significant progress towards goals and is finding the home use of PTNS to be effective. Patient's condition has the potential to improve in response to therapy. Maximum improvement is yet to be obtained. The anticipated improvement is attainable and reasonable in a generally predictable time. Patient will benefit from continued skilled therapeutic intervention to address remaining deficits in PFM coordination, PFM strength, IAP management, and posture in order to increase function and improve overall QOL.   PT Long Term Goals - 04/03/21 0955       PT LONG TERM GOAL #1   Title Patient will demonstrate independence with HEP in order to maximize therapeutic gains and improve carryover from physical therapy sessions to ADLs in the home and community.    Baseline IE: not demonstrated; 1/26: IND    Time 12    Period Weeks    Status Achieved    Target Date 03/27/21  PT LONG TERM GOAL #2   Title Patient will demonstrate improved function as evidenced by a score of 52 on FOTO measure for full participation in activities at home and in the community.    Baseline IE: 34; 1/26: 30; 2/9: 41    Time 12    Period Weeks    Status On-going    Target Date 06/26/21      PT LONG TERM GOAL #3   Title Patient will demonstrate improved function as evidenced by a score of <65 on FOTO PFDI Urinary measure for full participation in activities at home and in the community.    Baseline IE: 100; 1/26: 88; 2/9: 42    Time 12     Period Weeks    Status On-going    Target Date 06/26/21      PT LONG TERM GOAL #4   Title Patient will demonstrate circumferential and sequential contraction of >4/5 MMT, > 6 sec hold x10 and 5 consecutive quick flicks with </= 10 min rest between testing bouts, and relaxation of the PFM coordinated with breath for improved management of intra-abdominal pressure and normal bowel and bladder function without the presence of pain nor incontinence in order to improve participation at home and in the community.    Baseline IE: not demonstrated; 2/9: 3/5, x5 reps, significant cocontraction of abdominals but able to isolate with cueing    Time 12    Period Weeks    Status On-going    Target Date 06/26/21      PT LONG TERM GOAL #5   Title Patient will demonstrate coordinated lengthening and relaxation of PFM with diaphragmatic inhalation in order to decrease spasm and allow for unrestricted elimination of urine/feces for improved overall QOL.    Baseline IE: not demonstrated; 2/9: IND    Time 12    Period Weeks    Status Achieved    Target Date 06/26/21                   Plan - 04/03/21 1646     Clinical Impression Statement Patient presents to clinic with excellent motivation to participate in therapy. Patient demonstrates deficits in PFM coordination, PFM strength, IAP management, and posture. Patient with improved PFM strength with palpable squeeze and lift for 5 repetitions (in the presence of significant cocontractions) during today's session and has responded positively to active interventions. Patient has made significant progress towards goals and is finding the home use of PTNS to be effective. Patient's condition has the potential to improve in response to therapy. Maximum improvement is yet to be obtained. The anticipated improvement is attainable and reasonable in a generally predictable time. Patient will benefit from continued skilled therapeutic intervention to address  remaining deficits in PFM coordination, PFM strength, IAP management, and posture in order to increase function and improve overall QOL.    Personal Factors and Comorbidities Behavior Pattern;Comorbidity 3+;Time since onset of injury/illness/exacerbation;Past/Current Experience;Age;Finances    Comorbidities anxiety, ataxia, COPD, fibromyalgia, depression, seizure disorder, GERD, HTN    Examination-Activity Limitations Lift;Squat;Stairs;Locomotion Level;Carry;Sleep    Examination-Participation Restrictions Interpersonal Relationship;Laundry;Meal Prep;Community Activity;Cleaning    Stability/Clinical Decision Making Evolving/Moderate complexity    Rehab Potential Fair    PT Frequency 1x / week    PT Duration 12 weeks    PT Treatment/Interventions ADLs/Self Care Home Management;Moist Heat;Cryotherapy;Electrical Stimulation;Taping;Manual techniques;Patient/family education;Therapeutic exercise;Neuromuscular re-education;Therapeutic activities;Scar mobilization;Orthotic Fit/Training    PT Next Visit Plan PFM strengthening    PT Home Exercise Plan [id number]  Consulted and Agree with Plan of Care Patient             Patient will benefit from skilled therapeutic intervention in order to improve the following deficits and impairments:  Postural dysfunction, Decreased strength, Decreased coordination, Decreased activity tolerance, Improper body mechanics, Increased muscle spasms, Decreased endurance  Visit Diagnosis: Muscle weakness (generalized)  Other lack of coordination     Problem List There are no problems to display for this patient.   Myles Gip PT, DPT [phone]  04/03/2021, 5:01 PM  [facility] [address] [place], [place], [address] Phone: [phone]   Fax:  [phone]  Name: Anna Ayala MRN: [id number] Date of Birth: 08-11-1975

## 2021-04-07 ENCOUNTER — Ambulatory Visit
Admission: EM | Admit: 2021-04-07 | Discharge: 2021-04-07 | Disposition: A | Discharge status: Home / Self Care | Payer: Medicare HMO | Attending: Internal Medicine | Admitting: Internal Medicine

## 2021-04-07 ENCOUNTER — Other Ambulatory Visit: Payer: Self-pay

## 2021-04-07 DIAGNOSIS — R300 Dysuria: Secondary | ICD-10-CM | POA: Insufficient documentation

## 2021-04-07 DIAGNOSIS — N760 Acute vaginitis: Secondary | ICD-10-CM | POA: Insufficient documentation

## 2021-04-07 DIAGNOSIS — N3001 Acute cystitis with hematuria: Secondary | ICD-10-CM | POA: Diagnosis present

## 2021-04-07 DIAGNOSIS — B9689 Other specified bacterial agents as the cause of diseases classified elsewhere: Secondary | ICD-10-CM | POA: Insufficient documentation

## 2021-04-07 LAB — WET PREP, GENITAL
Sperm: NONE SEEN
Trich, Wet Prep: NONE SEEN
WBC, Wet Prep HPF POC: <10 (ref <10)
Yeast Wet Prep HPF POC: NONE SEEN

## 2021-04-07 LAB — URINALYSIS, COMPLETE (UACMP) WITH MICROSCOPIC
Bilirubin Urine: NEGATIVE
Glucose, UA: NEGATIVE mg/dL
Ketones, ur: NEGATIVE mg/dL
Nitrite: NEGATIVE
Protein, ur: 30 mg/dL — AB
RBC / HPF: >50 RBC/hpf (ref 0–5)
Specific Gravity, Urine: 1.02 (ref 1.005–1.030)
WBC, UA: >50 WBC/hpf (ref 0–5)
pH: 6.5 (ref 5.0–8.0)

## 2021-04-07 MED ORDER — CLINDAMYCIN PHOSPHATE 2 % VA CREA: 1.0000 | TOPICAL_CREAM | Freq: Every day | VAGINAL | 0 refills | Status: AC

## 2021-04-07 MED ORDER — SULFAMETHOXAZOLE-TRIMETHOPRIM 800-160 MG PO TABS: 1.0000 | ORAL_TABLET | Freq: Two times a day (BID) | ORAL | 0 refills | Status: AC

## 2021-04-07 NOTE — ED Triage Notes (Signed)
Patient presents to Urgent Care with complaints of abdominal pressure and dysuria over a week. Was treated with antibiotics with no improvement.   Denies fever.

## 2021-04-07 NOTE — ED Provider Notes (Addendum)
[facility]    CSN: [id number] Arrival date & time: 04/07/21  0806      History   Chief Complaint Chief Complaint  Patient presents with   Dysuria   Abdominal Pain    HPI Anna Ayala is a 46 y.o. female.   Patient is a 46 year old female who presents with complaint of abdominal pressure and dysuria.  She was seen here on February 3 with complaint of pain after urination, urinary frequency, hematuria and lower abdominal pressure for 1 week.  Urinalysis showed small leukocyte esterase and a few bacteria.  Wet prep also showed some clue cells.  Patient was treated with a course of Flagyl.  Today patient presents stating her pain continues and has not improved.  She reports pain mostly after urination as well as some suprapelvic abdominal pain.  She also reports bleeding when she is wiping.  Patient denies any vaginal discharge.  In addition to the Flagyl she has been taking ibuprofen.   Past Medical History:  Diagnosis Date   Aneurysm of anterior cerebral artery    Anxiety    Ataxia    COPD (chronic obstructive pulmonary disease) (HCC)    Depression    Dysarthria    Fibromyalgia    GERD (gastroesophageal reflux disease)    Head ache    Hypertension    Myalgia and myositis    Seizure ([place])    Stroke (HCC)    Tobacco abuse     There are no problems to display for this patient.   Past Surgical History:  Procedure Laterality Date   BRAIN SURGERY     ROBOTIC ASSISTED TOTAL HYSTERECTOMY WITH SACROCOLPOPEXY     supracervical hyst, with posterior repair    OB History     Gravida  4   Para      Term      Preterm      AB  2   Living  2      SAB  2   IAB      Ectopic      Multiple      Live Births  2            Home Medications    Prior to Admission medications   Medication Sig Start Date End Date Taking? Authorizing Provider  clindamycin (CLEOCIN) 2 % vaginal cream Place 1 Applicatorful vaginally at bedtime for 7 days. 04/07/21  04/14/21 Yes Luvenia Redden, PA-C  sulfamethoxazole-trimethoprim (BACTRIM DS) 800-160 MG tablet Take 1 tablet by mouth 2 (two) times daily for 7 days. 04/07/21 04/14/21 Yes Luvenia Redden, PA-C  albuterol (PROVENTIL) (2.5 MG/3ML) 0.083% nebulizer solution Take 2.5 mg by nebulization every 6 (six) hours as needed for wheezing or shortness of breath.    [provider]  albuterol (VENTOLIN HFA) 108 (90 Base) MCG/ACT inhaler Inhale into the lungs every 6 (six) hours as needed for wheezing or shortness of breath.    [provider]  aspirin 325 MG tablet Take 325 mg by mouth daily.    [provider]  cetirizine (ZYRTEC) 10 MG tablet Take 10 mg by mouth daily.    [provider]  fluticasone (FLOVENT HFA) 110 MCG/ACT inhaler Inhale into the lungs 2 (two) times daily.    [provider]  furosemide (LASIX) 40 MG tablet Take 40 mg by mouth.    [provider]  gabapentin (NEURONTIN) 300 MG capsule Take 300 mg by mouth 3 (three) times daily.  [provider]  hydrOXYzine (ATARAX/VISTARIL) 25 MG tablet Take 25 mg by mouth 2 (two) times daily.    [provider]  ketoconazole (NIZORAL) 2 % cream Apply 1 application topically daily.    [provider]  lamoTRIgine (LAMICTAL) 25 MG tablet Take by mouth. 08/03/20   [provider]  methocarbamol (ROBAXIN) 500 MG tablet Take 1-2 tablets by mouth 3 (three) times daily as needed. 06/23/20   [provider]  metroNIDAZOLE (FLAGYL) 500 MG tablet Take 1 tablet (500 mg total) by mouth 2 (two) times daily. 03/28/21   White, Leitha Schuller, NP  pantoprazole (PROTONIX) 40 MG tablet Take 40 mg by mouth daily.    [provider]  pregabalin (LYRICA) 100 MG capsule Take 100 mg by mouth 2 (two) times daily.    [provider]  promethazine-dextromethorphan (PROMETHAZINE-DM) 6.25-15 MG/5ML syrup Take 5 mLs by mouth 4 (four) times daily as needed. 08/10/20   Margarette Canada, NP  propranolol (INDERAL) 20 MG tablet Take 20 mg by mouth daily.    [provider]  QUEtiapine (SEROQUEL) 50 MG tablet SMARTSIG:4 Tablet(s) By Mouth Every Night PRN 08/03/20   [provider]  sertraline (ZOLOFT) 25 MG tablet Take 25 mg by mouth daily.    [provider]  tiZANidine (ZANAFLEX) 2 MG tablet Take 2 mg by mouth once.    [provider]    Family History Family History  Adopted: Yes    Social History Social History   Tobacco Use   Smoking status: Every Day    Types: Cigarettes   Smokeless tobacco: Never  Vaping Use   Vaping Use: Never used  Substance Use Topics   Alcohol use: No   Drug use: Not Currently     Allergies   Bee venom, Chantix [varenicline], Spiriva handihaler [tiotropium bromide monohydrate], Tramadol, and Diclofenac   Review of Systems Review of Systems as noted above in HPI.  Other systems reviewed and found to be negative   Physical Exam Triage Vital Signs ED Triage Vitals  Enc Vitals Group     BP 04/07/21 0826 116/80     Pulse Rate 04/07/21 0826 81     Resp 04/07/21 0826 16     Temp 04/07/21 0826 98.7 F (37.1 C)     Temp Source 04/07/21 0826 Oral     SpO2 04/07/21 0826 92 %     Weight --      Height --      Head Circumference --      Peak Flow --      Pain Score 04/07/21 0823 0     Pain Loc --      Pain Edu? --      Excl. in [place]? --    No data found.  Updated Vital Signs BP 116/80 (BP Location: Right Arm)    Pulse 81    Temp 98.7 F (37.1 C) (Oral)    Resp 16    LMP 09/16/2016    SpO2 92%   Visual Acuity Right Eye Distance:   Left Eye Distance:   Bilateral Distance:    Right Eye Near:   Left Eye Near:    Bilateral Near:     Physical Exam Constitutional:      Appearance: She is well-developed.  HENT:     Head: Normocephalic and atraumatic.  Cardiovascular:     Rate and Rhythm: Normal rate and regular rhythm.  Pulmonary:     Effort: Pulmonary effort is  normal. No  respiratory distress.  Abdominal:     General: Bowel sounds are normal.     Palpations: Abdomen is soft.     Tenderness: There is abdominal tenderness in the suprapubic area. There is no right CVA tenderness or left CVA tenderness.  Skin:    General: Skin is warm and dry.  Neurological:     General: No focal deficit present.     Mental Status: She is alert and oriented to person, place, and time.     UC Treatments / Results  Labs (all labs ordered are listed, but only abnormal results are displayed) Labs Reviewed  WET PREP, GENITAL - Abnormal; Notable for the following components:      Result Value   Clue Cells Wet Prep HPF POC PRESENT (*)    All other components within normal limits  URINALYSIS, COMPLETE (UACMP) WITH MICROSCOPIC - Abnormal; Notable for the following components:   APPearance CLOUDY (*)    Hgb urine dipstick LARGE (*)    Protein, ur 30 (*)    Leukocytes,Ua LARGE (*)    Bacteria, UA FEW (*)    All other components within normal limits  URINE CULTURE    EKG   Radiology No results found.  Procedures Procedures (including critical care time)  Medications Ordered in UC Medications - No data to display  Initial Impression / Assessment and Plan / UC Course  I have reviewed the triage vital signs and the nursing notes.  Pertinent labs & imaging results that were available during my care of the patient were reviewed by me and considered in my medical decision making (see chart for details).  Clinical Course as of 04/07/21 [id number]  [facility] Apr 07, 2021  0857 Color, Urine: YELLOW [MH]  0857 Urinalysis, Complete w Microscopic Urine, Clean Catch(!) [MH]  0857 Urinalysis, Complete w Microscopic Urine, Clean Catch(!) [MH]    Clinical Course User Index [MH] Luvenia Redden, PA-C   Patient returns after being seen here on her right third with diagnosis of dysuria and bacterial vaginosis.  Treated with 1 week of Flagyl.  Patient returns with complaint of continued  dysuria with pain after urination as well as blood with wiping.  Repeat urinalysis and vaginal swab.  Vaginal swab continues to retain clue cells despite the week of Flagyl.  Urinalysis now with large hemoglobin, large leukocyte esterase and few bacteria.  Will send for culture.  We will give her a course of clindamycin cream for vaginal administration.  Also give her a course of Bactrim. Send urine for culture. Final Clinical Impressions(s) / UC Diagnoses   Final diagnoses:  Acute cystitis with hematuria  BV (bacterial vaginosis)  Dysuria     Discharge Instructions      -Clindamycin: 1 applicatorful vaginally at bedtime for 7 days -Bactrim: 1 tablet twice a day for 7 days -Increase fluid intake -We will send the urine for culture and notify you if there is any changes that need to be made to your treatment regimen -Follow-up with primary care at this clinic should symptoms worsen or not improve.     ED Prescriptions     Medication Sig Dispense Auth. Provider   clindamycin (CLEOCIN) 2 % vaginal cream Place 1 Applicatorful vaginally at bedtime for 7 days. 40 g Luvenia Redden, PA-C   sulfamethoxazole-trimethoprim (BACTRIM DS) 800-160 MG tablet Take 1 tablet by mouth 2 (two) times daily for 7 days. 14 tablet Luvenia Redden, PA-C      PDMP not  reviewed this encounter.   Luvenia Redden, PA-C 04/07/21 0900    Luvenia Redden, PA-C 04/07/21 0901    Luvenia Redden, PA-C 04/07/21 [phone]

## 2021-04-07 NOTE — Discharge Instructions (Addendum)
-  Clindamycin: 1 applicatorful vaginally at bedtime for 7 days -Bactrim: 1 tablet twice a day for 7 days -Increase fluid intake -We will send the urine for culture and notify you if there is any changes that need to be made to your treatment regimen -Follow-up with primary care at this clinic should symptoms worsen or not improve.

## 2021-04-08 LAB — URINE CULTURE: Culture: 30000 — AB

## 2021-04-30 ENCOUNTER — Ambulatory Visit: Payer: Medicare HMO | Attending: Obstetrics and Gynecology | Admitting: Physical Therapy

## 2021-04-30 ENCOUNTER — Encounter: Payer: Self-pay | Admitting: Physical Therapy

## 2021-04-30 ENCOUNTER — Other Ambulatory Visit: Payer: Self-pay

## 2021-04-30 DIAGNOSIS — M25562 Pain in left knee: Secondary | ICD-10-CM | POA: Diagnosis present

## 2021-04-30 DIAGNOSIS — M25561 Pain in right knee: Secondary | ICD-10-CM | POA: Insufficient documentation

## 2021-04-30 DIAGNOSIS — G8929 Other chronic pain: Secondary | ICD-10-CM | POA: Diagnosis present

## 2021-04-30 DIAGNOSIS — R278 Other lack of coordination: Secondary | ICD-10-CM | POA: Insufficient documentation

## 2021-04-30 DIAGNOSIS — M6281 Muscle weakness (generalized): Secondary | ICD-10-CM | POA: Diagnosis present

## 2021-05-01 NOTE — Therapy (Signed)
[place] ?[facility] ?[address] [place], [place], [address] ?Phone: [phone]   Fax:  [phone] ? ?Physical Therapy Treatment ? ?Patient Details  ?Name: Anna Ayala ?MRN: [id number] ?Date of Birth: 11/03/1975 ?Referring Provider (PT): Lanetta InchSchroeder, Michelle ? ? ?Encounter Date: 04/30/2021 ? ? PT End of Session - 05/01/21 0830   ? ? Visit Number 6   ? Number of Visits 24   ? Date for PT Re-Evaluation 06/26/21   ? PT Start Time 1205   ? PT Stop Time 1245   ? PT Time Calculation (min) 40 min   ? Activity Tolerance Patient tolerated treatment well   ? Behavior [facility] for tasks assessed/performed   ? ?  ?  ? ?  ? ? ?Past Medical History:  ?Diagnosis Date  ? Aneurysm of anterior cerebral artery   ? Anxiety   ? Ataxia   ? COPD (chronic obstructive pulmonary disease) (HCC)   ? Depression   ? Dysarthria   ? Fibromyalgia   ? GERD (gastroesophageal reflux disease)   ? Head ache   ? Hypertension   ? Myalgia and myositis   ? Seizure (HCC)   ? Stroke [facility](HCC)   ? Tobacco abuse   ? ? ?Past Surgical History:  ?Procedure Laterality Date  ? BRAIN SURGERY    ? ROBOTIC ASSISTED TOTAL HYSTERECTOMY WITH SACROCOLPOPEXY    ? supracervical hyst, with posterior repair  ? ? ?There were no vitals filed for this visit. ? ? Subjective Assessment - 05/01/21 0827   ? ? Subjective Patient reports having increased difficulty controlling urge upon arriving at home and exiting vehicle as well as at home when rising from chair. Patient has finished course of 12 home treatments using e-stim over posterior tibial nerve and did note some beneift. Patient continues to have difficulty with prolonged storage noting voids every 60-90 minutes.   ? Currently in Pain? No/denies   ? ?  ?  ? ?  ? ?TREATMENT ?Neuromuscular Re-education: ?Patient educated extensively on typical bladder function, typical bladder habits, basics of urge suppression, bladder irritants in order to better regulate bladder through  behavioral changes.   ? ?Patient educated throughout session on appropriate technique and form using multi-modal cueing, HEP, and activity modification. Patient articulated understanding and returned demonstration. ? ?Patient Response to interventions: ?Comfortable to continue with HEP and add urge suppression strategies ? ?ASSESSMENT ?Patient presents to clinic with excellent motivation to participate in therapy. Patient demonstrates deficits in PFM coordination, PFM strength, IAP management, and posture. Patient participated in review of urge suppression strategies and was provided with handout for education reinforcement during today's session and responded positively to educational interventions. Patient will benefit from continued skilled therapeutic intervention to address remaining deficits in PFM coordination, PFM strength, IAP management, and posture in order to increase function and improve overall QOL. ? ? PT Long Term Goals - 04/03/21 0955   ? ?  ? PT LONG TERM GOAL #1  ? Title Patient will demonstrate independence with HEP in order to maximize therapeutic gains and improve carryover from physical therapy sessions to ADLs in the home and community.   ? Baseline IE: not demonstrated; 1/26: IND   ? Time 12   ? Period Weeks   ? Status Achieved   ? Target Date 03/27/21   ?  ? PT LONG TERM GOAL #2  ? Title Patient will demonstrate improved function as evidenced by a score of 52 on FOTO measure  for full participation in activities at home and in the community.   ? Baseline IE: 34; 1/26: 30; 2/9: 41   ? Time 12   ? Period Weeks   ? Status On-going   ? Target Date 06/26/21   ?  ? PT LONG TERM GOAL #3  ? Title Patient will demonstrate improved function as evidenced by a score of <65 on FOTO PFDI Urinary measure for full participation in activities at home and in the community.   ? Baseline IE: 100; 1/26: 88; 2/9: 42   ? Time 12   ? Period Weeks   ? Status On-going   ? Target Date 06/26/21   ?  ? PT LONG TERM GOAL  #4  ? Title Patient will demonstrate circumferential and sequential contraction of >4/5 MMT, > 6 sec hold x10 and 5 consecutive quick flicks with </= 10 min rest between testing bouts, and relaxation of the PFM coordinated with breath for improved management of intra-abdominal pressure and normal bowel and bladder function without the presence of pain nor incontinence in order to improve participation at home and in the community.   ? Baseline IE: not demonstrated; 2/9: 3/5, x5 reps, significant cocontraction of abdominals but able to isolate with cueing   ? Time 12   ? Period Weeks   ? Status On-going   ? Target Date 06/26/21   ?  ? PT LONG TERM GOAL #5  ? Title Patient will demonstrate coordinated lengthening and relaxation of PFM with diaphragmatic inhalation in order to decrease spasm and allow for unrestricted elimination of urine/feces for improved overall QOL.   ? Baseline IE: not demonstrated; 2/9: IND   ? Time 12   ? Period Weeks   ? Status Achieved   ? Target Date 06/26/21   ? ?  ?  ? ?  ? ? ? ? ? ? ? ? Plan - 05/01/21 0830   ? ? Clinical Impression Statement Patient presents to clinic with excellent motivation to participate in therapy. Patient demonstrates deficits in PFM coordination, PFM strength, IAP management, and posture. Patient participated in review of urge suppression strategies and was provided with handout for education reinforcement during today's session and responded positively to educational interventions. Patient will benefit from continued skilled therapeutic intervention to address remaining deficits in PFM coordination, PFM strength, IAP management, and posture in order to increase function and improve overall QOL.   ? Personal Factors and Comorbidities Behavior Pattern;Comorbidity 3+;Time since onset of injury/illness/exacerbation;Past/Current Experience;Age;Finances   ? Comorbidities anxiety, ataxia, COPD, fibromyalgia, depression, seizure disorder, GERD, HTN   ?  Examination-Activity Limitations Lift;Squat;Stairs;Locomotion Level;Carry;Sleep   ? Examination-Participation Restrictions Interpersonal Relationship;Laundry;Meal Prep;Community Activity;Cleaning   ? Stability/Clinical Decision Making Evolving/Moderate complexity   ? Rehab Potential Fair   ? PT Frequency 1x / week   ? PT Duration 12 weeks   ? PT Treatment/Interventions ADLs/Self Care Home Management;Moist Heat;Cryotherapy;Electrical Stimulation;Taping;Manual techniques;Patient/family education;Therapeutic exercise;Neuromuscular re-education;Therapeutic activities;Scar mobilization;Orthotic Fit/Training   ? PT Next Visit Plan PFM strengthening   ? PT Home Exercise Plan [id number]   ? Consulted and Agree with Plan of Care Patient   ? ?  ?  ? ?  ? ? ?Patient will benefit from skilled therapeutic intervention in order to improve the following deficits and impairments:  Postural dysfunction, Decreased strength, Decreased coordination, Decreased activity tolerance, Improper body mechanics, Increased muscle spasms, Decreased endurance ? ?Visit Diagnosis: ?Muscle weakness (generalized) ? ?Other lack of coordination ? ? ? ? ?Problem List ?There are no  problems to display for this patient. ? ? ?Sheria Lang PT, DPT [phone]  ?05/01/2021, 8:33 AM ? ?[place] ?[facility] ?[address] [place], [place], [address] ?Phone: [phone]   Fax:  [phone] ? ?Name: Anna Ayala ?MRN: [id number] ?Date of Birth: [birth year] ? ? ? ?

## 2021-05-07 ENCOUNTER — Ambulatory Visit: Payer: Medicare HMO | Admitting: Physical Therapy

## 2021-05-14 ENCOUNTER — Other Ambulatory Visit: Payer: Self-pay

## 2021-05-14 ENCOUNTER — Encounter: Payer: Self-pay | Admitting: Physical Therapy

## 2021-05-14 ENCOUNTER — Ambulatory Visit: Payer: Medicare HMO | Admitting: Physical Therapy

## 2021-05-14 DIAGNOSIS — R278 Other lack of coordination: Secondary | ICD-10-CM

## 2021-05-14 DIAGNOSIS — M6281 Muscle weakness (generalized): Secondary | ICD-10-CM

## 2021-05-14 NOTE — Therapy (Signed)
[place] ?[facility] ?[address] [place], [place], [address] ?Phone: [phone]   Fax:  [phone] ? ?Physical Therapy Treatment ? ?Patient Details  ?Name: Anna Ayala ?MRN: [id number] ?Date of Birth: 1975/10/25 ?Referring Provider (PT): Lanetta InchSchroeder, Michelle ? ? ?Encounter Date: 05/14/2021 ? ? PT End of Session - 05/14/21 1205   ? ? Visit Number 7   ? Number of Visits 24   ? Date for PT Re-Evaluation 06/26/21   ? PT Start Time 1155   ? PT Stop Time 1235   ? PT Time Calculation (min) 40 min   ? Activity Tolerance Patient tolerated treatment well   ? Behavior [facility] assessed/performed   ? ?  ?  ? ?  ? ? ?Past Medical History:  ?Diagnosis Date  ? Aneurysm of anterior cerebral artery   ? Anxiety   ? Ataxia   ? COPD (chronic obstructive pulmonary disease) (HCC)   ? Depression   ? Dysarthria   ? Fibromyalgia   ? GERD (gastroesophageal reflux disease)   ? Head ache   ? Hypertension   ? Myalgia and myositis   ? Seizure (HCC)   ? Stroke [facility](HCC)   ? Tobacco abuse   ? ? ?Past Surgical History:  ?Procedure Laterality Date  ? BRAIN SURGERY    ? ROBOTIC ASSISTED TOTAL HYSTERECTOMY WITH SACROCOLPOPEXY    ? supracervical hyst, with posterior repair  ? ? ?There were no vitals filed for this visit. ? ? Subjective Assessment - 05/14/21 1200   ? ? Subjective Patient notes that she is having occasional success with urge suppression. Patient did have on episode of complete loss of bladder with STS transfer. Patient does feel exercises are helpful and notes intent to continue with them, but does feel like she has maximized therapeutic benefit. Patient notes swelling in B knee joints and has new order for PT. Patient has not taken any pain medication this morning and notes 8/10 pain in B knees.   ? Currently in Pain? Yes   ? Pain Score 8    ? Pain Location Knee   ? Pain Orientation Left;Right   ? ?  ?  ? ?  ? ?TREATMENT ?Therapeutic Exercise: ?Reviewed HEP and urge suppression  strategies including, seated and standing abdominal work, gentle low impact aerobic exercise, PFM strengthening.  ?NuStep, seat 9, L1-3, x10 min for improved BLE strengthening and decreased edema as well as pain modulation ? ?Patient educated throughout session on appropriate technique and form using multi-modal cueing, HEP, and activity modification. Patient articulated understanding and returned demonstration. ? ?Patient Response to interventions: ?9/10 knee pain; no UI with seated aerobic training ? ?ASSESSMENT ?Patient presents to clinic with excellent motivation to participate in therapy. Patient demonstrates persistent deficits in PFM coordination and PFM strength. Patient has made progress toward goal achievement in all areas as set forth at the start of physical therapy, but DPT and patient both agree that maximum improvement within the context of physical therapy may have been attained.  Patient articulating intention to continue with HEP which may yield further improvement, but does not require further follow-up in clinic. At this time, patient will discharge back to urogynecology for further intervention while continuing with comprehensive HEP for the management of MUI and urinary frequency. ? ? PT Long Term Goals - 04/03/21 0955   ? ?  ? PT LONG TERM GOAL #1  ? Title Patient will demonstrate independence with HEP in order  to maximize therapeutic gains and improve carryover from physical therapy sessions to ADLs in the home and community.   ? Baseline IE: not demonstrated; 1/26: IND   ? Time 12   ? Period Weeks   ? Status Achieved   ? Target Date 03/27/21   ?  ? PT LONG TERM GOAL #2  ? Title Patient will demonstrate improved function as evidenced by a score of 52 on FOTO measure for full participation in activities at home and in the community.   ? Baseline IE: 34; 1/26: 30; 2/9: 41   ? Time 12   ? Period Weeks   ? Status On-going   ? Target Date 06/26/21   ?  ? PT LONG TERM GOAL #3  ? Title Patient will  demonstrate improved function as evidenced by a score of <65 on FOTO PFDI Urinary measure for full participation in activities at home and in the community.   ? Baseline IE: 100; 1/26: 88; 2/9: 42   ? Time 12   ? Period Weeks   ? Status On-going   ? Target Date 06/26/21   ?  ? PT LONG TERM GOAL #4  ? Title Patient will demonstrate circumferential and sequential contraction of >4/5 MMT, > 6 sec hold x10 and 5 consecutive quick flicks with </= 10 min rest between testing bouts, and relaxation of the PFM coordinated with breath for improved management of intra-abdominal pressure and normal bowel and bladder function without the presence of pain nor incontinence in order to improve participation at home and in the community.   ? Baseline IE: not demonstrated; 2/9: 3/5, x5 reps, significant cocontraction of abdominals but able to isolate with cueing   ? Time 12   ? Period Weeks   ? Status On-going   ? Target Date 06/26/21   ?  ? PT LONG TERM GOAL #5  ? Title Patient will demonstrate coordinated lengthening and relaxation of PFM with diaphragmatic inhalation in order to decrease spasm and allow for unrestricted elimination of urine/feces for improved overall QOL.   ? Baseline IE: not demonstrated; 2/9: IND   ? Time 12   ? Period Weeks   ? Status Achieved   ? Target Date 06/26/21   ? ?  ?  ? ?  ? ? ? ? ? ? ? ? Plan - 05/14/21 1234   ? ? Clinical Impression Statement Patient presents to clinic with excellent motivation to participate in therapy. Patient demonstrates persistent deficits in PFM coordination and PFM strength. Patient has made progress toward goal achievement in all areas as set forth at the start of physical therapy, but DPT and patient both agree that maximum improvement within the context of physical therapy may have been attained.  Patient articulating intention to continue with HEP which may yield further improvement, but does not require further follow-up in clinic. At this time, patient will discharge  back to urogynecology for further intervention while continuing with comprehensive HEP for the management of MUI and urinary frequency.   ? Personal Factors and Comorbidities Behavior Pattern;Comorbidity 3+;Time since onset of injury/illness/exacerbation;Past/Current Experience;Age;Finances   ? Comorbidities anxiety, ataxia, COPD, fibromyalgia, depression, seizure disorder, GERD, HTN   ? Examination-Activity Limitations Lift;Squat;Stairs;Locomotion Level;Carry;Sleep   ? Examination-Participation Restrictions Interpersonal Relationship;Laundry;Meal Prep;Community Activity;Cleaning   ? Stability/Clinical Decision Making Evolving/Moderate complexity   ? Rehab Potential Fair   ? PT Frequency 1x / week   ? PT Duration 12 weeks   ? PT Treatment/Interventions ADLs/Self Care Home Management;Moist  Heat;Cryotherapy;Electrical Stimulation;Taping;Manual techniques;Patient/family education;Therapeutic exercise;Neuromuscular re-education;Therapeutic activities;Scar mobilization;Orthotic Fit/Training   ? PT Home Exercise Plan [id number]   ? Consulted and Agree with Plan of Care Patient   ? ?  ?  ? ?  ? ? ?Patient will benefit from skilled therapeutic intervention in order to improve the following deficits and impairments:  Postural dysfunction, Decreased strength, Decreased coordination, Decreased activity tolerance, Improper body mechanics, Increased muscle spasms, Decreased endurance ? ?Visit Diagnosis: ?Muscle weakness (generalized) ? ?Other lack of coordination ? ? ? ? ?Problem List ?There are no problems to display for this patient. ? ? ?Sheria Lang PT, DPT [phone]  ?05/14/2021, 12:51 PM ? ? ?[facility] ?[address] [place], [place], [address] ?Phone: [phone]   Fax:  [phone] ? ?Name: Anna Ayala ?MRN: [id number] ?Date of Birth: 05/28/1975 ? ? ? ?

## 2021-05-21 ENCOUNTER — Ambulatory Visit: Payer: Medicare HMO | Admitting: Physical Therapy

## 2021-05-21 ENCOUNTER — Encounter: Payer: Self-pay | Admitting: Physical Therapy

## 2021-05-21 DIAGNOSIS — M6281 Muscle weakness (generalized): Secondary | ICD-10-CM

## 2021-05-21 DIAGNOSIS — G8929 Other chronic pain: Secondary | ICD-10-CM

## 2021-05-21 NOTE — Therapy (Signed)
?OUTPATIENT PHYSICAL THERAPY LOWER EXTREMITY EVALUATION ? ? ?Patient Name: Anna Ayala ?MRN: [id number] ?DOB:[birth year], 46 y.o., female ?Today's Date: 05/21/2021 ? ? PT End of Session - 05/21/21 1204   ? ? Visit Number 1   ? Number of Visits 8   ? Date for PT Re-Evaluation 07/16/21   ? Authorization Type IE 05/21/2021   ? PT Start Time 1205   ? PT Stop Time 1245   ? PT Time Calculation (min) 40 min   ? Activity Tolerance Patient limited by pain   ? Behavior [facility] for tasks assessed/performed   ? ?  ?  ? ?  ? ? ?Past Medical History:  ?Diagnosis Date  ? Aneurysm of anterior cerebral artery   ? Anxiety   ? Ataxia   ? COPD (chronic obstructive pulmonary disease) (HCC)   ? Depression   ? Dysarthria   ? Fibromyalgia   ? GERD (gastroesophageal reflux disease)   ? Head ache   ? Hypertension   ? Myalgia and myositis   ? Seizure (HCC)   ? Stroke [facility](HCC)   ? Tobacco abuse   ? ?Past Surgical History:  ?Procedure Laterality Date  ? BRAIN SURGERY    ? ROBOTIC ASSISTED TOTAL HYSTERECTOMY WITH SACROCOLPOPEXY    ? supracervical hyst, with posterior repair  ? ?There are no problems to display for this patient. ? ? ?PCP: [facility] [place] [facility] ? ?REFERRING PROVIDER: Ambrose [place], [place] ? ?REFERRING DIAG: M17.0 (ICD-10-CM) - Bilateral primary osteoarthritis of knee ? ?THERAPY DIAG:  ?Muscle weakness (generalized) ? ?Chronic pain of left knee ? ?Chronic pain of right knee ? ?ONSET DATE: 10/2021 ? ?SUBJECTIVE:  ? ?SUBJECTIVE STATEMENT: ?Patient notes longstanding history of B knee pain. She has lately been having difficulty getting into and out of the bathtub because she cannot bend her knee. Patient notes that after recent trial of NuStep she had high pain intensity for about 3-4 days. Patient notes R bothers her worse than L but the L swels worse than the R. Patient also has B popping and catching in the knees which is painful. Patient reports that she also has difficulty getting up and down off the floor.  ? ?PERTINENT  HISTORY: ?Familiar to clinic; presence of seizure disorder and longstanding difficulty with bladder control which has limited participation in exercise and community activities.  ? ?PAIN:  ?Are you having pain? Yes: NPRS scale: 8/10 ?Pain location: B knees, R>L ?Pain description: aching, shooting pain ?Aggravating factors: activity, prolonged sitting or standing, stair climbing ?Relieving factors: ice, gentle motion ?24 hour pain behavior: worse in the AM ? ?PRECAUTIONS: Other: seizure disorder ? ?WEIGHT BEARING RESTRICTIONS No ? ?FALLS:  ?Has patient fallen in last 6 months? No ? ?LIVING ENVIRONMENT: ?Lives with: lives with their family ?Lives in: House/apartment ?Stairs: No ? ?PATIENT GOALS "To be able to bend and tolerate pain more" ? ? ?OBJECTIVE:  ? ?DIAGNOSTIC FINDINGS: Imaging/other tests:  ?X-ray bilateral knee: 04/28/2021 ?My interpretation: Mild osteoarthritis, right greater than left. No Delrae Alfredosenberg or merchant views available ? ? ?PATIENT SURVEYS:  ?FOTO 23 ? ?COGNITION: ? Overall cognitive status: Within functional limits for tasks assessed   ?  ?SENSATION: ?Light touch: WFL ? ?MUSCLE LENGTH: ?Hamstrings: Right 90 deg; Left 160 deg ? ?POSTURE:  ?Increased thoracic kyphosis with posteirorly tilte pelvis.  ? ?PALPATION: ?TTP of both patellar tendons, R patella, R lateral knee at ITB insertion. Patellar mobilizations with and without movement are relieving, B.  ? ?LE ROM: ? ?Active ROM  Right ?05/21/2021 Left ?05/21/2021  ?Hip flexion WNL WNL  ?Hip extension    ?Hip abduction    ?Hip adduction    ?Hip internal rotation WNL WNL  ?Hip external rotation WNL WNL  ?Knee flexion (seated) 112 108  ?Knee extension 0 -2  ? (Blank rows = not tested) ? ?LE MMT: ? ?MMT Right ?05/21/2021 Left ?05/21/2021  ?Hip flexion 3- 3-  ?Hip extension    ?Hip abduction 3 3  ?Hip adduction 3 3  ?Hip internal rotation    ?Hip external rotation    ?Knee flexion 3+ 5  ?Knee extension 3 4  ?Ankle dorsiflexion    ?Ankle plantarflexion    ?Ankle  inversion    ?Ankle eversion    ? (Blank rows = not tested) ? ?LOWER EXTREMITY SPECIAL TESTS:  ?Knee special tests: McMurray's test: positive ; B ? ?FUNCTIONAL TESTS:  ?5 times sit to stand: 21.54 sec ? ?GAIT: ?Distance walked: 70 ft ?Assistive device utilized: None ?Level of assistance: Complete Independence ?Comments: Patient ambulates with limited foot clearance B and hip ER during swing and stance phase. ? ? ? ?ASSESSMENT: ? ?CLINICAL IMPRESSION: ?Patient is a 45 year old presenting to clinic with chief complaints of B knee pain and decreased tolerance to activity as a result. Today's evaluation is suggestive of deficits in B pain free knee ROM, BLE strength, gait as evidenced by B hip ER with swing and stance phases of gait with limited foot clearance B, AROM knee flexion R 112 degrees, AROM knee flexion L 108 degrees, TTP over lateral aspect of R knee, TTP of B patellar tendons, R>L, presence of catch and pop in B knees with + McMurray test B. Both knees are generally stiff and hypomobile with joint mobilizations (both anterior and posterior) aggravating to the joints. Patient's responses on FOTO outcome measures (23) indicate significant functional limitations/disability/distress. Patient's progress may be limited due to persistence of pain and time since onset; however, patient's motivation is advantageous. Patient was able to achieve mild relief of pain with patellar mobilizations with and without movement during today's evaluation and responded positively to manual interventions. Patient will benefit from continued skilled therapeutic intervention to address deficits in B pain free knee ROM, BLE strength, gait in order to manage pain, increase function, and improve overall QOL.  ? ? ?OBJECTIVE IMPAIRMENTS Abnormal gait, decreased activity tolerance, decreased endurance, difficulty walking, decreased ROM, decreased strength, improper body mechanics, and pain.  ? ?ACTIVITY LIMITATIONS community activity,  yard work, shopping, and yard work.  ? ?PERSONAL FACTORS Behavior pattern, Fitness, Past/current experiences, Time since onset of injury/illness/exacerbation, and 3+ comorbidities: fibromyalgia, anxiety, ataxia, COPD, GERD, seizure disorder  are also affecting patient's functional outcome.  ? ? ?REHAB POTENTIAL: Fair   ? ?CLINICAL DECISION MAKING: Evolving/moderate complexity ? ?EVALUATION COMPLEXITY: Moderate ? ? ?GOALS: ?Goals reviewed with patient? Yes ? ?LONG TERM GOALS: Target date: 07/16/2021 ? ?Patient will demonstrate improved function as evidenced by a score of 45 on FOTO measure for full participation in activities at home and in the community.  ?Baseline: 23 ?Goal status: INITIAL ? ?2.  Patient will decrease worst pain as reported on NPRS by at least 2 points to demonstrate clinically significant reduction in pain in order to restore/improve function and overall QOL. ?Baseline:  ?Goal status: INITIAL ? ?3.  Patient will demonstrate improved B active knee flexion to be > 120 degrees with pain controlled at or below 6/10 for improved participation at home and in the community.  ?Baseline: R  112, L 108, 8-9/10 pain ?Goal status: INITIAL ? ?4.  Patient will decrease 5TSTS by at least 3 seconds without UE support nor compensatory patterns in order to demonstrate clinically significant improvement in LE strength. ?Baseline: 21.54 sec, intermittent UE use for initiation of movement ?Goal status: INITIAL ? ? ? ? ?PLAN: ?PT FREQUENCY: 1x/week ? ?PT DURATION: 8 weeks ? ?PLANNED INTERVENTIONS: Therapeutic exercises, Therapeutic activity, Neuromuscular re-education, Balance training, Gait training, Patient/Family education, Joint mobilization, Cryotherapy, Taping, and Manual therapy ? ?PLAN FOR NEXT SESSION: manual as needed, gentle ROM, gentle stretching ? ? Sheria Lang PT, DPT [phone]  ?05/21/2021, 12:05 PM ? ?

## 2021-05-22 ENCOUNTER — Encounter: Payer: Self-pay | Admitting: Ophthalmology

## 2021-05-28 NOTE — Discharge Instructions (Signed)

## 2021-05-29 ENCOUNTER — Encounter
Admission: RE | Disposition: A | Discharge status: Home / Self Care | Payer: Self-pay | Source: Home / Self Care | Attending: Ophthalmology

## 2021-05-29 ENCOUNTER — Ambulatory Visit: Payer: Medicare HMO | Admitting: Anesthesiology

## 2021-05-29 ENCOUNTER — Encounter: Payer: Self-pay | Admitting: Ophthalmology

## 2021-05-29 ENCOUNTER — Other Ambulatory Visit: Payer: Self-pay

## 2021-05-29 ENCOUNTER — Ambulatory Visit
Admission: RE | Admit: 2021-05-29 | Discharge: 2021-05-29 | Disposition: A | Discharge status: Home / Self Care | Payer: Medicare HMO | Attending: Ophthalmology | Admitting: Ophthalmology

## 2021-05-29 DIAGNOSIS — H02403 Unspecified ptosis of bilateral eyelids: Secondary | ICD-10-CM | POA: Diagnosis not present

## 2021-05-29 DIAGNOSIS — I69351 Hemiplegia and hemiparesis following cerebral infarction affecting right dominant side: Secondary | ICD-10-CM | POA: Diagnosis not present

## 2021-05-29 DIAGNOSIS — F17210 Nicotine dependence, cigarettes, uncomplicated: Secondary | ICD-10-CM | POA: Diagnosis not present

## 2021-05-29 DIAGNOSIS — H02834 Dermatochalasis of left upper eyelid: Secondary | ICD-10-CM | POA: Insufficient documentation

## 2021-05-29 DIAGNOSIS — M797 Fibromyalgia: Secondary | ICD-10-CM | POA: Diagnosis not present

## 2021-05-29 DIAGNOSIS — F32A Depression, unspecified: Secondary | ICD-10-CM | POA: Diagnosis not present

## 2021-05-29 DIAGNOSIS — J449 Chronic obstructive pulmonary disease, unspecified: Secondary | ICD-10-CM | POA: Insufficient documentation

## 2021-05-29 DIAGNOSIS — K219 Gastro-esophageal reflux disease without esophagitis: Secondary | ICD-10-CM | POA: Insufficient documentation

## 2021-05-29 DIAGNOSIS — R569 Unspecified convulsions: Secondary | ICD-10-CM | POA: Diagnosis not present

## 2021-05-29 DIAGNOSIS — H02833 Dermatochalasis of right eye, unspecified eyelid: Secondary | ICD-10-CM | POA: Diagnosis not present

## 2021-05-29 DIAGNOSIS — F419 Anxiety disorder, unspecified: Secondary | ICD-10-CM | POA: Insufficient documentation

## 2021-05-29 DIAGNOSIS — H02831 Dermatochalasis of right upper eyelid: Secondary | ICD-10-CM | POA: Insufficient documentation

## 2021-05-29 DIAGNOSIS — I10 Essential (primary) hypertension: Secondary | ICD-10-CM | POA: Diagnosis not present

## 2021-05-29 DIAGNOSIS — I671 Cerebral aneurysm, nonruptured: Secondary | ICD-10-CM | POA: Diagnosis not present

## 2021-05-29 HISTORY — PX: BROW LIFT: SHX178

## 2021-05-29 LAB — GLUCOSE, CAPILLARY: Glucose-Capillary: 125 mg/dL — ABNORMAL HIGH (ref 70–99)

## 2021-05-29 SURGERY — BLEPHAROPLASTY
Anesthesia: General | Laterality: Bilateral

## 2021-05-29 MED ORDER — OXYCODONE-ACETAMINOPHEN 5-325 MG PO TABS: 1.0000 | ORAL_TABLET | ORAL | 0 refills | Status: DC | PRN

## 2021-05-29 MED ORDER — PROPOFOL 500 MG/50ML IV EMUL
INTRAVENOUS | Status: DC | PRN
  Administered 2021-05-29: 25 ug/kg/min via INTRAVENOUS

## 2021-05-29 MED ORDER — TETRACAINE HCL 0.5 % OP SOLN
OPHTHALMIC | Status: DC | PRN
  Administered 2021-05-29: 1 [drp] via OPHTHALMIC

## 2021-05-29 MED ORDER — LIDOCAINE-EPINEPHRINE 2 %-1:100000 IJ SOLN
INTRAMUSCULAR | Status: DC | PRN
  Administered 2021-05-29: 3 mL via OPHTHALMIC

## 2021-05-29 MED ORDER — ERYTHROMYCIN 5 MG/GM OP OINT
TOPICAL_OINTMENT | OPHTHALMIC | Status: DC | PRN
  Administered 2021-05-29: 1 via OPHTHALMIC

## 2021-05-29 MED ORDER — MIDAZOLAM HCL 2 MG/2ML IJ SOLN
INTRAMUSCULAR | Status: DC | PRN
  Administered 2021-05-29: 2 mg via INTRAVENOUS

## 2021-05-29 MED ORDER — LACTATED RINGERS IV SOLN: INTRAVENOUS | Status: DC

## 2021-05-29 MED ORDER — PROPOFOL 10 MG/ML IV BOLUS
INTRAVENOUS | Status: DC | PRN
  Administered 2021-05-29: 30 mg via INTRAVENOUS
  Administered 2021-05-29: 40 mg via INTRAVENOUS

## 2021-05-29 MED ORDER — ERYTHROMYCIN 5 MG/GM OP OINT: TOPICAL_OINTMENT | OPHTHALMIC | 2 refills | Status: DC

## 2021-05-29 SURGICAL SUPPLY — 23 items
APPLICATOR COTTON TIP WD 3 STR (MISCELLANEOUS) ×3 IMPLANT
BLADE SURG 15 STRL LF DISP TIS (BLADE) ×1 IMPLANT
BLADE SURG 15 STRL SS (BLADE) ×2
CORD BIP STRL DISP 12FT (MISCELLANEOUS) ×2 IMPLANT
GAUZE SPONGE 4X4 12PLY STRL (GAUZE/BANDAGES/DRESSINGS) ×2 IMPLANT
GLOVE SURG UNDER POLY LF SZ7 (GLOVE) ×4 IMPLANT
GOWN STRL REUS W/ TWL LRG LVL3 (GOWN DISPOSABLE) ×1 IMPLANT
GOWN STRL REUS W/TWL LRG LVL3 (GOWN DISPOSABLE) ×2
MARKER SKIN XFINE TIP W/RULER (MISCELLANEOUS) ×2 IMPLANT
NDL FILTER BLUNT 18X1 1/2 (NEEDLE) ×1 IMPLANT
NDL HYPO 30X.5 LL (NEEDLE) ×2 IMPLANT
NEEDLE FILTER BLUNT 18X 1/2SAF (NEEDLE) ×1
NEEDLE FILTER BLUNT 18X1 1/2 (NEEDLE) ×1 IMPLANT
NEEDLE HYPO 30X.5 LL (NEEDLE) ×4 IMPLANT
PACK ENT CUSTOM (PACKS) ×2 IMPLANT
SOL PREP PVP 2OZ (MISCELLANEOUS) ×2
SOLUTION PREP PVP 2OZ (MISCELLANEOUS) ×1 IMPLANT
SPONGE GAUZE 2X2 8PLY STRL LF (GAUZE/BANDAGES/DRESSINGS) ×20 IMPLANT
SUT GUT PLAIN 6-0 1X18 ABS (SUTURE) ×2 IMPLANT
SUT PROLENE 6 0 P 1 18 (SUTURE) ×2 IMPLANT
SYR 10ML LL (SYRINGE) ×2 IMPLANT
SYR 3ML LL SCALE MARK (SYRINGE) ×2 IMPLANT
WATER STERILE IRR 250ML POUR (IV SOLUTION) ×2 IMPLANT

## 2021-05-29 NOTE — Anesthesia Procedure Notes (Addendum)
Date/Time: 05/29/2021 1:05 PM ?Performed by: Jimmy Picket, CRNA ?Pre-anesthesia Checklist: Patient identified, Emergency Drugs available, Suction available, Timeout performed and Patient being monitored ?Patient Re-evaluated:Patient Re-evaluated prior to induction ?Oxygen Delivery Method: Nasal cannula ?Placement Confirmation: positive ETCO2 ? ? ? ? ?

## 2021-05-29 NOTE — Interval H&P Note (Signed)
History and Physical Interval Note: ? ?05/29/2021 ?12:50 PM ? 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  The various methods of treatment have been discussed with the patient and family. After consideration of risks, benefits and other options for treatment, the patient has consented to  Procedure(s) with comments: ?BLEPHAROPLASTY UPPER EYLEID; W/EXCESS SKIN BLEPHAROPTOSIS REPAIR; RESECT EX BILATERAL (Bilateral) - Diabetic as a surgical intervention.  The patient's history has been reviewed, patient examined, no change in status, stable for surgery.  I have reviewed the patient's chart and labs.  Questions were answered to the patient's satisfaction.   ? ? ?Vickki Muff Challis Crill M ? ? ?

## 2021-05-29 NOTE — Transfer of Care (Signed)
Immediate Anesthesia Transfer of Care Note ? ?Patient: Anna Ayala ? ?Procedure(s) Performed: BLEPHAROPLASTY UPPER EYLEID; W/EXCESS SKIN BLEPHAROPTOSIS REPAIR; RESECT EX BILATERAL (Bilateral) ? ?Patient Location: PACU ? ?Anesthesia Type: General ? ?Level of Consciousness: awake, alert  and patient cooperative ? ?Airway and Oxygen Therapy: Patient Spontanous Breathing and Patient connected to supplemental oxygen ? ?Post-op Assessment: Post-op Vital signs reviewed, Patient's Cardiovascular Status Stable, Respiratory Function Stable, Patent Airway and No signs of Nausea or vomiting ? ?Post-op Vital Signs: Reviewed and stable ? ?Complications: No notable events documented. ? ?

## 2021-05-29 NOTE — Op Note (Signed)
Preoperative Diagnosis: ? ?1. Visually significant blepharoptosis bilateral  Upper Eyelid(s) ?2. Visually significant dermatochalasis bilateral  Upper Eyelid(s) ? ?Postoperative Diagnosis: ? ?Same. ? ?Procedure(s) Performed:  ? ?1. Blepharoptosis repair with levator aponeurosis advancement bilateral  Upper Eyelid(s) ?2. Upper eyelid blepharoplasty with excess skin excision  bilateral  Upper Eyelid(s) ? ?Surgeon: Philis Pique. Vickki Muff, M.D. ? ?Assistants: none ? ?Anesthesia: MAC ? ?Specimens: None. ? ?Estimated Blood Loss: Minimal. ? ?Complications: None. ? ?Operative Findings: None Dictated ? ?Procedure:  ? ?Allergies were reviewed and the patient Bee venom, Chantix [varenicline], Spiriva handihaler [tiotropium bromide monohydrate], Tramadol, and Diclofenac.  ? ?After the risks, benefits, complications and alternatives were discussed with the patient, appropriate informed consent was obtained.  While seated in an upright position and looking in primary gaze, the mid pupillary line was marked on the upper eyelid margins bilaterally. The patient was then brought to the operating suite and reclined supine.  Timeout was conducted and the patient was sedated.  Local anesthetic consisting of a 50-50 mixture of 2% lidocaine with epinephrine and 0.75% bupivacaine with added Hylenex was injected subcutaneously to both  upper eyelid(s). After adequate local was instilled, the patient was prepped and draped in the usual sterile fashion for eyelid surgery.  ? ?Attention was turned to the upper eyelids. A 79m upper eyelid crease incision line was marked with calipers on both  upper eyelid(s).  A pinch test was used to estimate the amount of excess skin to remove and this was marked in standard blepharoplasty style fashion. Attention was turned to the  right  upper eyelid. A #15 blade was used to open the premarked incision line. A Skin only flap was excised and hemostasis was obtained with bipolar cautery.  ? ?Westcott scissors were  then used to transect through orbicularis down to the tarsal plate. Epitarsus was dissected to create a smooth surface to suture to. Dissection was then carried superiorly in the plane between orbicularis and orbital septum. Once the preaponeurotic fat pocket was identified, the orbital septum was opened. This revealed the levator and its aponeurosis.   ? ?Attention was then turned to the opposite eyelid where the same procedure was performed in the same manner. Hemostasis was obtained with bipolar cautery throughout.  ? ?3 interrupted 6-0 Prolene sutures were then passed partial thickness through the tarsal plates of both  upper eyelid(s). These sutures were placed in line with the mid pupillary, medial limbal, and lateral limbal lines. The sutures were fixed to the levator aponeurosis and adjusted until a nice lid height and contour were achieved. Once nice symmetry was achieved, the skin incisions were closed with a running 6-0 plain gut suture. The patient tolerated the procedure well.  Erythromycin ophthalmic ophthalmic ointment was applied to the incision site(s) followed by ice packs. The patient was taken to the recovery area where she recovered without difficulty. ? ?Post-Op Plan/Instructions:  ? ?The patient was instructed to use ice packs frequently for the next 48 hours. She was instructed to use Erythromycin ophthalmic ophthalmic ointment on her incisions 4 times a day for the next 12 to 14 days. Shewas given a prescription for tramadol (or similar) for pain control should Tylenol not be effective. [facility] [place] [facility] or sooner as needed for problems. ? ?Yianni Skilling M. FVickki Muff M.D. ?Ophthalmology ? ? ?

## 2021-05-29 NOTE — H&P (Signed)
[place]: [facility] ? ?Primary Care Physician:  [facility] ?Ophthalmologist: Dr. Philis Pique. Vickki Muff, M.D. ? ?Pre-Procedure History & Physical: ?HPI:  Anna Ayala is a 46 y.o. female here for periocular surgery. ?  ?Past Medical History:  ?Diagnosis Date  ? Aneurysm of anterior cerebral artery   ? Anxiety   ? Ataxia   ? COPD (chronic obstructive pulmonary disease) ([place])   ? Depression   ? Dysarthria   ? Fibromyalgia   ? GERD (gastroesophageal reflux disease)   ? Head ache   ? Hypertension   ? Myalgia and myositis   ? Seizure ([place])   ? "Seizure-like activity" per neuro  ? Stroke [facility])   ? TIAs per pt. x6 last in2020. Some Right sided weakness  ? Tobacco abuse   ? ? ?Past Surgical History:  ?Procedure Laterality Date  ? BRAIN SURGERY    ? ROBOTIC ASSISTED TOTAL HYSTERECTOMY WITH SACROCOLPOPEXY    ? supracervical hyst, with posterior repair  ? ? ?Prior to Admission medications   ?Medication Sig Start Date End Date Taking? Authorizing Provider  ?albuterol (PROVENTIL) (2.5 MG/3ML) 0.083% nebulizer solution Take 2.5 mg by nebulization every 6 (six) hours as needed for wheezing or shortness of breath.   Yes [provider]  ?albuterol (VENTOLIN HFA) 108 (90 Base) MCG/ACT inhaler Inhale into the lungs every 6 (six) hours as needed for wheezing or shortness of breath.   Yes [provider]  ?aspirin 325 MG tablet Take 325 mg by mouth daily.   Yes [provider]  ?cetirizine (ZYRTEC) 10 MG tablet Take 10 mg by mouth daily.   Yes [provider]  ?fluticasone (FLONASE) 50 MCG/ACT nasal spray Place 1 spray into both nostrils 2 (two) times daily.   Yes [provider]  ?fluticasone (FLOVENT HFA) 110 MCG/ACT inhaler Inhale into the lungs 2 (two) times daily.   Yes [provider]  ?fluticasone-salmeterol (ADVAIR) 500-50 MCG/ACT AEPB Inhale 1 puff into the lungs in the morning and at bedtime.   Yes [provider]  ?furosemide (LASIX) 40 MG tablet  Take 40 mg by mouth.   Yes [provider]  ?gabapentin (NEURONTIN) 300 MG capsule Take 300 mg by mouth 3 (three) times daily.   Yes [provider]  ?ketoconazole (NIZORAL) 2 % cream Apply 1 application topically daily.   Yes [provider]  ?lamoTRIgine (LAMICTAL) 25 MG tablet Take by mouth 2 (two) times daily. 200 mg AM, 250 mg PM 08/03/20  Yes [provider]  ?lisinopril (ZESTRIL) 5 MG tablet Take 5 mg by mouth daily.   Yes [provider]  ?metFORMIN (GLUCOPHAGE) 500 MG tablet Take 500 mg by mouth daily with supper.   Yes [provider]  ?methocarbamol (ROBAXIN) 500 MG tablet Take 1-2 tablets by mouth 3 (three) times daily as needed. 06/23/20  Yes [provider]  ?metroNIDAZOLE (FLAGYL) 500 MG tablet Take 1 tablet (500 mg total) by mouth 2 (two) times daily. 03/28/21  Yes White, Leitha Schuller, NP  ?nystatin cream (MYCOSTATIN) Apply 1 application. topically 2 (two) times daily.   Yes [provider]  ?pantoprazole (PROTONIX) 40 MG tablet Take 80 mg by mouth daily.   Yes [provider]  ?pregabalin (LYRICA) 100 MG capsule Take 150 mg by mouth 2 (two) times daily. 150 mg AM, 300 mg PM   Yes [provider]  ?promethazine-dextromethorphan (PROMETHAZINE-DM) 6.25-15 MG/5ML syrup Take 5 mLs by mouth 4 (four) times daily as  needed. 08/10/20  Yes Margarette Canada, NP  ?propranolol (INDERAL) 20 MG tablet Take 40 mg by mouth 2 (two) times daily.   Yes [provider]  ?QUEtiapine (SEROQUEL) 50 MG tablet SMARTSIG:4 Tablet(s) By Mouth Every Night PRN 08/03/20  Yes [provider]  ?sertraline (ZOLOFT) 25 MG tablet Take 25 mg by mouth daily.   Yes [provider]  ? ? ?Allergies as of 04/02/2021 - Review Complete 03/28/2021  ?Allergen Reaction Noted  ? Bee venom Anaphylaxis 12/13/2016  ? Chantix [varenicline] Anaphylaxis 05/26/2015  ? Spiriva handihaler [tiotropium bromide monohydrate] Shortness Of Breath 05/26/2015  ?  Tramadol Palpitations 12/13/2016  ? Diclofenac Rash 12/13/2016  ? ? ?Family History  ?Adopted: Yes  ? ? ?Social History  ? ?Socioeconomic History  ? Marital status: Single  ?  Spouse name: Not on file  ? Number of children: Not on file  ? Years of education: Not on file  ? Highest education level: Not on file  ?Occupational History  ? Not on file  ?Tobacco Use  ? Smoking status: Every Day  ?  Packs/day: 1.50  ?  Years: 34.00  ?  Pack years: 51.00  ?  Types: Cigarettes  ? Smokeless tobacco: Never  ? Tobacco comments:  ?  Started smoking around age 76  ?Vaping Use  ? Vaping Use: Never used  ?Substance and Sexual Activity  ? Alcohol use: No  ? Drug use: Not Currently  ? Sexual activity: Yes  ?  Birth control/protection: None  ?Other Topics Concern  ? Not on file  ?Social History Narrative  ? Not on file  ? ?Social Determinants of Health  ? ?Financial Resource Strain: Not on file  ?Food Insecurity: Not on file  ?Transportation Needs: Not on file  ?Physical Activity: Not on file  ?Stress: Not on file  ?Social Connections: Not on file  ?Intimate Partner Violence: Not on file  ? ? ?Review of Systems: ?See HPI, otherwise negative ROS ? ?Physical Exam: ?BP 119/82   Pulse 91   Temp 97.7 ?F (36.5 ?C) (Temporal)   Ht 5\' 9"  (1.753 m)   Wt 97.5 kg   LMP 09/16/2016   SpO2 94%   BMI 31.75 kg/m?  ?General:   Alert and cooperative in NAD ?Head:  Normocephalic and atraumatic. ?Respiratory:  Normal work of breathing. ? ?Impression/Plan: ?Anna Ayala is here for periocular surgery. ? ?Risks, benefits, limitations, and alternatives regarding surgery have been reviewed with the patient.  Questions have been answered.  All parties agreeable. ? ? ?Anna Starch, MD  05/29/2021, 12:50 PM  ? ?

## 2021-05-29 NOTE — Anesthesia Postprocedure Evaluation (Signed)
Anesthesia Post Note ? ?Patient: Anna Ayala ? ?Procedure(s) Performed: BLEPHAROPLASTY UPPER EYLEID; W/EXCESS SKIN BLEPHAROPTOSIS REPAIR; RESECT EX BILATERAL (Bilateral) ? ? ?  ?Patient location during evaluation: PACU ?Anesthesia Type: General ?Level of consciousness: awake and alert ?Pain management: pain level controlled ?Vital Signs Assessment: post-procedure vital signs reviewed and stable ?Respiratory status: spontaneous breathing, nonlabored ventilation, respiratory function stable and patient connected to nasal cannula oxygen ?Cardiovascular status: blood pressure returned to baseline and stable ?Postop Assessment: no apparent nausea or vomiting ?Anesthetic complications: no ? ? ?No notable events documented. ? ?Loni Beckwith ? ? ? ? ? ?

## 2021-05-29 NOTE — Anesthesia Preprocedure Evaluation (Signed)
Anesthesia Evaluation  ?Patient identified by MRN, date of birth, ID band ?Patient awake ? ? ? ?Reviewed: ?Allergy & Precautions, H&P , NPO status , Patient's Chart, lab work & pertinent test results, reviewed documented beta blocker date and time  ? ?Airway ?Mallampati: II ? ?TM Distance: >3 FB ?Neck ROM: full ? ? ? Dental ?no notable dental hx. ? ?  ?Pulmonary ?neg pulmonary ROS, COPD, Current Smoker,  ?  ?Pulmonary exam normal ?breath sounds clear to auscultation ? ? ? ? ? ? Cardiovascular ?Exercise Tolerance: Good ?hypertension, negative cardio ROS ? ? ?Rhythm:regular Rate:Normal ? ? ?  ?Neuro/Psych ? Headaches, Seizures -,  Anxiety Depression Cerebral aneurysm ?TIA (TIAs per pt. x6 last in2020. Some Right sided weakness)CVA negative neurological ROS ? negative psych ROS  ? GI/Hepatic ?negative GI ROS, Neg liver ROS, GERD  ,  ?Endo/Other  ?negative endocrine ROS ? Renal/GU ?negative Renal ROS  ?negative genitourinary ?  ?Musculoskeletal ? ?(+) Fibromyalgia - ? Abdominal ?  ?Peds ? Hematology ?negative hematology ROS ?(+)   ?Anesthesia Other Findings ? ? Reproductive/Obstetrics ?negative OB ROS ? ?  ? ? ? ? ? ? ? ? ? ? ? ? ? ?  ?  ? ? ? ? ? ? ? ? ?Anesthesia Physical ?Anesthesia Plan ? ?ASA: 3 ? ?Anesthesia Plan: General  ? ?Post-op Pain Management:   ? ?Induction:  ? ?PONV Risk Score and Plan: 2 and Propofol infusion, TIVA and Treatment may vary due to age or medical condition ? ?Airway Management Planned:  ? ?Additional Equipment:  ? ?Intra-op Plan:  ? ?Post-operative Plan:  ? ?Informed Consent: I have reviewed the patients History and Physical, chart, labs and discussed the procedure including the risks, benefits and alternatives for the proposed anesthesia with the patient or authorized representative who has indicated his/her understanding and acceptance.  ? ? ? ?Dental Advisory Given ? ?Plan Discussed with: CRNA ? ?Anesthesia Plan Comments:   ? ? ? ? ? ? ?Anesthesia Quick  Evaluation ? ?

## 2021-05-30 ENCOUNTER — Encounter: Payer: Self-pay | Admitting: Ophthalmology

## 2021-06-04 ENCOUNTER — Encounter: Payer: Medicare HMO | Admitting: Physical Therapy

## 2021-06-11 ENCOUNTER — Encounter: Payer: Medicare HMO | Admitting: Physical Therapy

## 2021-06-18 ENCOUNTER — Encounter: Payer: Self-pay | Admitting: Obstetrics and Gynecology

## 2021-06-18 ENCOUNTER — Ambulatory Visit (INDEPENDENT_AMBULATORY_CARE_PROVIDER_SITE_OTHER): Payer: Medicare HMO | Admitting: Obstetrics and Gynecology

## 2021-06-18 VITALS — BP 134/86 | HR 85

## 2021-06-18 DIAGNOSIS — N3281 Overactive bladder: Secondary | ICD-10-CM

## 2021-06-18 NOTE — Progress Notes (Signed)
[facility] ?Return Visit ? ?SUBJECTIVE  ?History of Present Illness: ?Anna Ayala is a 46 y.o. female seen in follow-up for OAB and SUI. Plan at last visit was to decrease bladder irritants (coffee, Dr Reino Kent, sweet tea) and start physical therapy.  ? ?The leakage is bad- wears pullups. Cannot tell when she has the leakage, sometimes it just happens. Has tried transcutanous tibial nerve stimulation with PT for about 6 weeks and did not see improvement. Usually cannot get to the bathroom fast enough. Does not have leakage with cough, sneeze or laugh unless she is sick.  ?She is drinking 8 bottles of water a day and cut back on the coffee and Dr Reino Kent. Has also lost some weight.  ? ?- 11/08/17 s/p Robotic supracervical hysterectomy, bilateral salpingectomy, mesh sacrocolpopexy, cystoscopy ?- 01/2018- posterior repair ? ?Past Medical History: ?Patient  has a past medical history of Aneurysm of anterior cerebral artery, Anxiety, Ataxia, COPD (chronic obstructive pulmonary disease) (HCC), Depression, Dysarthria, Fibromyalgia, GERD (gastroesophageal reflux disease), Head ache, Hypertension, Myalgia and myositis, Seizure (HCC), Stroke (HCC), and Tobacco abuse.  ? ?Past Surgical History: ?She  has a past surgical history that includes Brain surgery; Robotic assisted total hysterectomy with sacrocolpopexy; and Brow lift (Bilateral, 05/29/2021).  ? ?Medications: ?She has a current medication list which includes the following prescription(s): albuterol, albuterol, aspirin, cetirizine, erythromycin, fluticasone, fluticasone, fluticasone-salmeterol, furosemide, gabapentin, ketoconazole, lamotrigine, lisinopril, metformin, methocarbamol, metronidazole, nystatin cream, oxycodone-acetaminophen, pantoprazole, pregabalin, promethazine-dextromethorphan, propranolol, quetiapine, and sertraline.  ? ?Allergies: ?Patient is allergic to bee venom, chantix [varenicline], spiriva handihaler [tiotropium bromide monohydrate],  tramadol, and diclofenac.  ? ?Social History: ?Patient  reports that she has been smoking cigarettes. She has a 51.00 pack-year smoking history. She has never used smokeless tobacco. She reports that she does not currently use drugs. She reports that she does not drink alcohol.  ?  ?  ?OBJECTIVE  ?  ? ?Physical Exam: ?Vitals:  ? 06/18/21 1102  ?BP: 134/86  ?Pulse: 85  ? ?Gen: No apparent distress, A&O x 3. ? ?Detailed Urogynecologic Evaluation:  ?Deferred.   ? ?ASSESSMENT AND PLAN  ?  ?Anna Ayala is a 46 y.o. with:  ?1. Overactive bladder   ? ? ?- will further evaluate leakage with urodynamic testing ?- Due to her history of mesh sacrocolpopexy, will also plan for office cystoscopy to ensure no mesh erosion ?- Discussed options of medication for OAB, botox and SNM.  ? ?Marguerita Beards, MD ? ?Time spent: I spent 20 minutes dedicated to the care of this patient on the date of this encounter to include pre-visit review of records, face-to-face time with the patient and post visit documentation. ? ?

## 2021-06-19 ENCOUNTER — Encounter: Payer: Medicare HMO | Admitting: Physical Therapy

## 2021-06-27 ENCOUNTER — Ambulatory Visit (INDEPENDENT_AMBULATORY_CARE_PROVIDER_SITE_OTHER): Payer: Medicare HMO | Admitting: Obstetrics and Gynecology

## 2021-06-27 ENCOUNTER — Encounter: Payer: Self-pay | Admitting: Obstetrics and Gynecology

## 2021-06-27 VITALS — BP 127/84 | HR 116

## 2021-06-27 DIAGNOSIS — R350 Frequency of micturition: Secondary | ICD-10-CM | POA: Diagnosis not present

## 2021-06-27 DIAGNOSIS — N3281 Overactive bladder: Secondary | ICD-10-CM | POA: Diagnosis not present

## 2021-06-27 DIAGNOSIS — N3941 Urge incontinence: Secondary | ICD-10-CM

## 2021-06-27 LAB — POCT URINALYSIS DIPSTICK
Appearance: NORMAL
Bilirubin, UA: NEGATIVE
Blood, UA: NEGATIVE
Glucose, UA: NEGATIVE
Ketones, UA: NEGATIVE
Leukocytes, UA: NEGATIVE
Nitrite, UA: NEGATIVE
Protein, UA: NEGATIVE
Spec Grav, UA: 1.025 (ref 1.010–1.025)
Urobilinogen, UA: 1 E.U./dL
pH, UA: 6 (ref 5.0–8.0)

## 2021-06-27 MED ORDER — MIRABEGRON ER 25 MG PO TB24: 25.0000 mg | ORAL_TABLET | Freq: Every day | ORAL | 5 refills | Status: AC

## 2021-06-27 NOTE — Progress Notes (Signed)
Anna Ayala ?Return Visit ? ?SUBJECTIVE  ?History of Present Illness: ?Anna Ayala is a 46 y.o. female seen in follow-up for overactive bladder. Cystoscopy today was normal.  ? ?She has attended physical therapy for 8 sessions and has not seen improvement. She has also done percutaneous tibial nerve stimulation with PT which did not improve her symptoms. She still is having large volume leakage with no warning.  ? ? ?Past Medical History: ?Patient  has a past medical history of Aneurysm of anterior cerebral artery, Anxiety, Ataxia, COPD (chronic obstructive pulmonary disease) (HCC), Depression, Dysarthria, Fibromyalgia, GERD (gastroesophageal reflux disease), Head ache, Hypertension, Myalgia and myositis, Seizure (HCC), Stroke (HCC), and Tobacco abuse.  ? ?Past Surgical History: ?She  has a past surgical history that includes Brain surgery; Robotic assisted total hysterectomy with sacrocolpopexy; and Brow lift (Bilateral, 05/29/2021).  ? ?Medications: ?She has a current medication list which includes the following prescription(s): mirabegron er, albuterol, albuterol, aspirin, cetirizine, erythromycin, fluticasone, fluticasone, fluticasone-salmeterol, furosemide, gabapentin, ketoconazole, lamotrigine, lisinopril, metformin, methocarbamol, metronidazole, nystatin cream, oxycodone-acetaminophen, pantoprazole, pregabalin, promethazine-dextromethorphan, propranolol, quetiapine, and sertraline.  ? ?Allergies: ?Patient is allergic to bee venom, chantix [varenicline], spiriva handihaler [tiotropium bromide monohydrate], tramadol, and diclofenac.  ? ?Social History: ?Patient  reports that she has been smoking cigarettes. She has a 51.00 pack-year smoking history. She has never used smokeless tobacco. She reports that she does not currently use drugs. She reports that she does not drink alcohol.  ?  ?  ?OBJECTIVE  ?  ? ?Physical Exam: ?Vitals:  ? 06/27/21 1358  ?BP: 127/84  ?Pulse: (!) 116  ? ?Gen: No apparent  distress, A&O x 3. ? ?Detailed Urogynecologic Evaluation:  ?Deferred.   ? ?ASSESSMENT AND PLAN  ?  ?Anna Ayala is a 46 y.o. with:  ?1. Overactive bladder   ? ?- We discussed the symptoms of overactive bladder (OAB), which include urinary urgency, urinary frequency, nocturia, with or without urge incontinence. We discussed management including behavioral therapy (decreasing bladder irritants, urge suppression strategies, timed voids, bladder retraining), medication; for refractory cases posterior tibial nerve stimulation, sacral neuromodulation, and intravesical botulinum toxin injection.  ?- Prescribed myrbetriq 25mg  daily and samples provided.  ? ?Return 6 weeks for follow up ? ? , MD ? ? ?

## 2021-06-27 NOTE — Patient Instructions (Addendum)
Taking Care of Yourself after Urodynamics, Cystoscopy, Coaptite Injection, or Botox Injection   Drink plenty of water for a day or two following your procedure. Try to have about 8 ounces (one cup) at a time, and do this 6 times or more per day unless you have fluid restrictitons AVOID irritative beverages such as coffee, tea, soda, alcoholic or citrus drinks for a day or two, as this may cause burning with urination.  For the first 1-2 days after the procedure, your urine may be pink or red in color. You may have some blood in your urine as a normal side effect of the procedure. Large amounts of bleeding or difficulty urinating are NOT normal. Call the nurse line if this happens or go to the nearest Emergency Room if the bleeding is heavy or you cannot urinate at all and it is after hours. You may experience some discomfort or a burning sensation with urination after having this procedure. You can use over the counter Azo or pyridium to help with burning and follow the instructions on the packaging. If it does not improve within 1-2 days, or other symptoms appear (fever, chills, or difficulty urinating) call the office to speak to a nurse.  You may return to normal daily activities such as work, school, driving, exercising and housework on the day of the procedure.  

## 2021-06-27 NOTE — Progress Notes (Signed)
CYSTOSCOPY ? ?CC:  This is a 46 y.o. with refractory urge incontinence who presents today for cystoscopy. She has a history of mesh sacrocolpopexy in 2019 and cystoscopy is being performed to rule out mesh erosion.  ? ?POC urine: neg ? ? ?CYSTOSCOPY: ?A time out was performed.  The periurethral area was prepped and draped in a sterile manner.  2% lidocaine jetpack was inserted at the urethral meatus and the urethra and bladder visualized with 0- and 70-degree scopes.  She had normal urethral coaptation and normal urethral mucosa.  She had normal bladder mucosa with trabeculations. She had bilateral clear efflux from both ureteral orifices.  She had no squamous metaplasia at the trigone, no cellules or diverticuli.   ? ? ?ASSESSMENT:  ?46 y.o. with  urge incontinence s/p mesh prolapse repair . Cystoscopy today is normal. ? ?PLAN:  ?Follow-up to discuss treatment for OAB/  ? ?Marguerita Beards, MD ? ?

## 2021-07-02 ENCOUNTER — Encounter: Payer: Self-pay | Admitting: Physical Therapy

## 2021-07-02 ENCOUNTER — Ambulatory Visit: Payer: Medicare HMO | Attending: Obstetrics and Gynecology | Admitting: Physical Therapy

## 2021-07-02 DIAGNOSIS — M6281 Muscle weakness (generalized): Secondary | ICD-10-CM | POA: Diagnosis present

## 2021-07-02 DIAGNOSIS — G8929 Other chronic pain: Secondary | ICD-10-CM | POA: Insufficient documentation

## 2021-07-02 DIAGNOSIS — M25561 Pain in right knee: Secondary | ICD-10-CM | POA: Diagnosis present

## 2021-07-02 DIAGNOSIS — M25562 Pain in left knee: Secondary | ICD-10-CM | POA: Diagnosis present

## 2021-07-02 NOTE — Therapy (Signed)
?OUTPATIENT PHYSICAL THERAPY TREATMENT NOTE ? ? ?Patient Name: Anna Ayala ?MRN: [id number] ?DOB:1975/12/05, 46 y.o., female ?Today's Date: 07/02/2021 ? ?PCP: [facility] ?REFERRING PROVIDER: Ambrose Pancoast ? ?END OF SESSION:  ? PT End of Session - 07/02/21 1038   ? ? Visit Number 2   ? Number of Visits 8   ? Date for PT Re-Evaluation 07/16/21   ? Authorization Type IE 05/21/2021   ? PT Start Time 1030   ? PT Stop Time 1110   ? PT Time Calculation (min) 40 min   ? Activity Tolerance Patient limited by pain   ? Behavior During Therapy [facility] for tasks assessed/performed   ? ?  ?  ? ?  ? ? ?Past Medical History:  ?Diagnosis Date  ? Aneurysm of anterior cerebral artery   ? Anxiety   ? Ataxia   ? COPD (chronic obstructive pulmonary disease) (HCC)   ? Depression   ? Dysarthria   ? Fibromyalgia   ? GERD (gastroesophageal reflux disease)   ? Head ache   ? Hypertension   ? Myalgia and myositis   ? Seizure (HCC)   ? "Seizure-like activity" per neuro  ? Stroke [facility])   ? TIAs per pt. x6 last in2020. Some Right sided weakness  ? Tobacco abuse   ? ?Past Surgical History:  ?Procedure Laterality Date  ? BRAIN SURGERY    ? BROW LIFT Bilateral 05/29/2021  ? Procedure: BLEPHAROPLASTY UPPER EYLEID; W/EXCESS SKIN BLEPHAROPTOSIS REPAIR; RESECT EX BILATERAL;  Surgeon: Imagene Riches, MD;  Location[facility];  Service: Ophthalmology;  Laterality: Bilateral;  Diabetic  ? ROBOTIC ASSISTED TOTAL HYSTERECTOMY WITH SACROCOLPOPEXY    ? supracervical hyst, with posterior repair  ? ?There are no problems to display for this patient. ? ? ?REFERRING DIAG: M17.0 (ICD-10-CM) - Bilateral primary osteoarthritis of knee  ? ?THERAPY DIAG:  ?Muscle weakness (generalized) ? ?Chronic pain of left knee ? ?Chronic pain of right knee ? ? ?PRECAUTIONS: seizure disorder ? ?SUBJECTIVE: Patient returns to clinic after a brief hiatus in therapy due to eye surgery. Patient note knee pain has been about the same. Patient notes that pain is expected  with her arthritis.  ? ?PAIN:  ?Are you having pain? Yes: NPRS scale: 8/10 ?Pain location: B knees ?Pain description: aching, pressure ? ? ?OBJECTIVE: (objective measures completed at initial evaluation unless otherwise dated) ? ?TREATMENT ? ?Pre-treatment assessment: ? ?Manual Therapy: ?STM and TPR performed to B quads and adductors to allow for decreased tension and pain and improved posture and function with vibratory and percussive device ?Patellar mobilizations, B, all directions for improved joint mobility and decreased pain ?A/P and P/A tib-fem mobilizations for improved mobility, grade II/III ? ? ?Neuromuscular Re-education: ? ? ?Therapeutic Exercise: ?SAQ, 2x15, B ?Supine heel slides, B, pain controlled ROM, x15 ?SLR, B, x10  ? ?Treatments unbilled: ? ?Post-treatment assessment: ? ?Patient educated throughout session on appropriate technique and form using multi-modal cueing, HEP, and activity modification. Patient articulated understanding and returned demonstration. ? ?Patient Response to interventions: ?7/10 ? ?ASSESSMENT: ? ?CLINICAL IMPRESSION: ?Patient presents to clinic with excellent motivation to participate in therapy. Patient demonstrates deficits in B pain free knee ROM, BLE strength, gait. Patient able to achieve 1 point reduction in B knee pain during today's session and responded positively to manual and active interventions. Patient will benefit from continued skilled therapeutic intervention to address remaining deficits in B pain free knee ROM, BLE strength, gait in order to increase function and  improve overall QOL.  ? ?OBJECTIVE IMPAIRMENTS Abnormal gait, decreased activity tolerance, decreased endurance, difficulty walking, decreased ROM, decreased strength, improper body mechanics, and pain.  ? ?ACTIVITY LIMITATIONS community activity, yard work, shopping, and yard work.  ? ?PERSONAL FACTORS Behavior pattern, Fitness, Past/current experiences, Time since onset of  injury/illness/exacerbation, and 3+ comorbidities: fibromyalgia, anxiety, ataxia, COPD, GERD, seizure disorder are also affecting patient's functional outcome.  ? ? ?REHAB POTENTIAL: Fair  ? ?CLINICAL DECISION MAKING: Evolving/moderate complexity ? ?EVALUATION COMPLEXITY: Moderate ? ? ?GOALS: ?Goals reviewed with patient? Yes ? ?LONG TERM GOALS: Target date: 07/16/2021 ? ?Patient will demonstrate improved function as evidenced by a score of 45 on FOTO measure for full participation in activities at home and in the community.  ?Baseline: 23 ?Goal status: INITIAL ? ?2.  Patient will decrease worst pain as reported on NPRS by at least 2 points to demonstrate clinically significant reduction in pain in order to restore/improve function and overall QOL. ?Baseline:  ?Goal status: INITIAL ? ?3.  Patient will demonstrate improved B active knee flexion to be > 120 degrees with pain controlled at or below 6/10 for improved participation at home and in the community.  ?Baseline: R 112, L 108, 8-9/10 pain ?Goal status: INITIAL ? ?4.  Patient will decrease 5TSTS by at least 3 seconds without UE support nor compensatory patterns in order to demonstrate clinically significant improvement in LE strength. ?Baseline: 21.54 sec, intermittent UE use for initiation of movement ?Goal status: INITIAL ? ? ? ? ?PLAN: ?PT FREQUENCY: 1x/week ? ?PT DURATION: 8 weeks ? ?PLANNED INTERVENTIONS: Therapeutic exercises, Therapeutic activity, Neuromuscular re-education, Balance training, Gait training, Patient/Family education, Joint mobilization, Cryotherapy, Taping, and Manual therapy ? ?PLAN FOR NEXT SESSION: manual as needed, gentle ROM, gentle stretching ? ? ?Sheria Lang PT, DPT [phone]  ?07/02/2021, 10:39 AM ? ?  ? ?

## 2021-07-09 ENCOUNTER — Encounter: Payer: Medicare HMO | Admitting: Physical Therapy

## 2021-07-17 ENCOUNTER — Encounter: Payer: Medicare HMO | Admitting: Physical Therapy

## 2021-08-06 ENCOUNTER — Ambulatory Visit: Payer: Medicare HMO | Admitting: Obstetrics and Gynecology

## 2021-08-06 NOTE — Progress Notes (Deleted)
Anna Ayala  SUBJECTIVE  History of Present Illness: Anna Ayala is a 46 y.o. female seen in follow-up for ***. Plan at last Ayala was ***.     Past Medical History: Patient  has a past medical history of Aneurysm of anterior cerebral artery, Anxiety, Ataxia, COPD (chronic obstructive pulmonary disease) ([place]), Depression, Dysarthria, Fibromyalgia, GERD (gastroesophageal reflux disease), Head ache, Hypertension, Myalgia and myositis, Seizure ([place]), Stroke ([place]), and Tobacco abuse.   Past Surgical History: She  has a past surgical history that includes Brain surgery; Robotic assisted total hysterectomy with sacrocolpopexy; and Brow lift (Bilateral, 05/29/2021).   Medications: She has a current medication list which includes the following prescription(s): albuterol, albuterol, aspirin, cetirizine, erythromycin, fluticasone, fluticasone, fluticasone-salmeterol, furosemide, gabapentin, ketoconazole, lamotrigine, lisinopril, metformin, methocarbamol, metronidazole, mirabegron er, nystatin cream, oxycodone-acetaminophen, pantoprazole, pregabalin, promethazine-dextromethorphan, propranolol, quetiapine, and sertraline.   Allergies: Patient is allergic to bee venom, chantix [varenicline], spiriva handihaler [tiotropium bromide monohydrate], tramadol, and diclofenac.   Social History: Patient  reports that she has been smoking cigarettes. She has a 51.00 pack-year smoking history. She has never used smokeless tobacco. She reports that she does not currently use drugs. She reports that she does not drink alcohol.      OBJECTIVE     Physical Exam: There were no vitals filed for this Ayala. Gen: No apparent distress, A&O x 3.  Detailed Urogynecologic Evaluation:  Deferred. Prior exam showed:      No data to display             ASSESSMENT AND PLAN    Ms. Top is a 46 y.o. with:  No diagnosis found.

## 2021-09-29 ENCOUNTER — Ambulatory Visit
Admission: EM | Admit: 2021-09-29 | Discharge: 2021-09-29 | Disposition: A | Discharge status: Home / Self Care | Payer: Medicare HMO

## 2021-09-29 ENCOUNTER — Ambulatory Visit (INDEPENDENT_AMBULATORY_CARE_PROVIDER_SITE_OTHER): Payer: Medicare HMO

## 2021-09-29 DIAGNOSIS — M25551 Pain in right hip: Secondary | ICD-10-CM | POA: Diagnosis not present

## 2021-09-29 DIAGNOSIS — M545 Low back pain, unspecified: Secondary | ICD-10-CM | POA: Diagnosis not present

## 2021-09-29 DIAGNOSIS — M542 Cervicalgia: Secondary | ICD-10-CM

## 2021-09-29 MED ORDER — METHYLPREDNISOLONE 4 MG PO TBPK: ORAL_TABLET | ORAL | 0 refills | Status: AC
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
## 2021-09-29 NOTE — Discharge Instructions (Addendum)
Neck xray Moderate multilevel degenerative disc disease. No acute abnormality seen. Back xray Moderate degenerative disc disease is noted at L2-3. No acute abnormality is noted. Hip xray Negative. Tylenol Extra strength as directed do not exceed 3600 mg per day Medrol dosepak to help with the pain and inflammation Referral to physical therapy if symptoms persist
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
## 2021-10-08 ENCOUNTER — Ambulatory Visit: Payer: Medicare HMO | Attending: Nurse Practitioner | Admitting: Physical Therapy

## 2021-10-08 ENCOUNTER — Encounter: Payer: Self-pay | Admitting: Physical Therapy

## 2021-10-08 DIAGNOSIS — M6281 Muscle weakness (generalized): Secondary | ICD-10-CM | POA: Insufficient documentation

## 2021-10-08 DIAGNOSIS — M545 Low back pain, unspecified: Secondary | ICD-10-CM | POA: Diagnosis present

## 2021-10-08 DIAGNOSIS — M25551 Pain in right hip: Secondary | ICD-10-CM | POA: Insufficient documentation
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
## 2021-10-09 ENCOUNTER — Ambulatory Visit: Payer: Medicare HMO | Admitting: Physical Therapy

## 2021-10-14 ENCOUNTER — Encounter: Payer: Medicare HMO | Admitting: Physical Therapy

## 2021-10-16 ENCOUNTER — Ambulatory Visit: Payer: Medicare HMO | Admitting: Physical Therapy

## 2021-10-16 ENCOUNTER — Encounter: Payer: Self-pay | Admitting: Physical Therapy

## 2021-10-16 DIAGNOSIS — M545 Low back pain, unspecified: Secondary | ICD-10-CM

## 2021-10-16 DIAGNOSIS — M25551 Pain in right hip: Secondary | ICD-10-CM

## 2021-10-16 DIAGNOSIS — M6281 Muscle weakness (generalized): Secondary | ICD-10-CM
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
## 2021-10-21 ENCOUNTER — Ambulatory Visit: Payer: Medicare HMO | Admitting: Physical Therapy

## 2021-10-21 DIAGNOSIS — M545 Low back pain, unspecified: Secondary | ICD-10-CM

## 2021-10-21 DIAGNOSIS — M6281 Muscle weakness (generalized): Secondary | ICD-10-CM

## 2021-10-21 DIAGNOSIS — M25551 Pain in right hip: Secondary | ICD-10-CM
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
## 2021-10-22 ENCOUNTER — Ambulatory Visit
Admission: RE | Admit: 2021-10-22 | Discharge: 2021-10-22 | Disposition: A | Discharge status: Home / Self Care | Payer: Self-pay | Source: Ambulatory Visit | Attending: Orthopedic Surgery | Admitting: Orthopedic Surgery

## 2021-10-22 ENCOUNTER — Other Ambulatory Visit: Payer: Self-pay

## 2021-10-22 ENCOUNTER — Encounter: Payer: Self-pay | Admitting: Physical Therapy

## 2021-10-22 DIAGNOSIS — Z049 Encounter for examination and observation for unspecified reason: Secondary | ICD-10-CM

## 2021-10-23 ENCOUNTER — Ambulatory Visit: Payer: Medicare HMO | Admitting: Physical Therapy

## 2021-10-23 DIAGNOSIS — M6281 Muscle weakness (generalized): Secondary | ICD-10-CM

## 2021-10-23 DIAGNOSIS — M545 Low back pain, unspecified: Secondary | ICD-10-CM | POA: Diagnosis not present

## 2021-10-23 DIAGNOSIS — M25551 Pain in right hip: Secondary | ICD-10-CM
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
## 2021-10-24 ENCOUNTER — Encounter: Payer: Self-pay | Admitting: Physical Therapy

## 2021-10-28 ENCOUNTER — Encounter: Payer: Self-pay | Admitting: Physical Therapy

## 2021-10-28 ENCOUNTER — Ambulatory Visit: Payer: Medicare HMO | Attending: Nurse Practitioner | Admitting: Physical Therapy

## 2021-10-28 DIAGNOSIS — M545 Low back pain, unspecified: Secondary | ICD-10-CM | POA: Diagnosis present

## 2021-10-28 DIAGNOSIS — M6281 Muscle weakness (generalized): Secondary | ICD-10-CM | POA: Diagnosis present

## 2021-10-28 DIAGNOSIS — M25551 Pain in right hip: Secondary | ICD-10-CM | POA: Insufficient documentation
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
## 2021-10-30 ENCOUNTER — Ambulatory Visit: Payer: Medicare HMO | Admitting: Physical Therapy

## 2021-11-04 ENCOUNTER — Ambulatory Visit: Payer: Self-pay | Admitting: Orthopedic Surgery

## 2021-11-04 ENCOUNTER — Encounter: Payer: Medicare HMO | Admitting: Physical Therapy

## 2021-11-06 ENCOUNTER — Encounter: Payer: Medicare HMO | Admitting: Physical Therapy

## 2021-11-11 ENCOUNTER — Encounter: Payer: Medicare HMO | Admitting: Physical Therapy

## 2021-11-13 ENCOUNTER — Telehealth: Payer: Self-pay | Admitting: Physical Therapy

## 2021-11-13 ENCOUNTER — Ambulatory Visit: Payer: Medicare HMO | Admitting: Physical Therapy

## 2021-11-13 NOTE — Telephone Encounter (Addendum)
Patient called to cancel her appointment today prior to 10:30 AM. Patient had aphasia and was unable to communicate clearly to front office staff or physical therapist. Patient has Hx of TIA x 6 and Hx of seizure disorder. Pt was alone at home at the time and unable to communicate any other specific symptoms she was having. Attempted communication with her husband; he stated that he would get in contact with patient. Notified EMS of concern for possible TIA or stroke given significant history and clear speech deficit/aphasia (pt did communicate clearly during previous follow ups with PT).

## 2021-11-18 ENCOUNTER — Ambulatory Visit: Payer: Medicare HMO | Admitting: Physical Therapy

## 2021-11-20 ENCOUNTER — Ambulatory Visit: Payer: Medicare HMO | Admitting: Physical Therapy
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
## 2021-11-25 ENCOUNTER — Ambulatory Visit (INDEPENDENT_AMBULATORY_CARE_PROVIDER_SITE_OTHER): Payer: Medicare HMO | Admitting: Orthopedic Surgery

## 2021-11-25 ENCOUNTER — Encounter: Payer: Self-pay | Admitting: Orthopedic Surgery

## 2021-11-25 VITALS — BP 131/85 | HR 88 | Ht 69.0 in | Wt 207.2 lb

## 2021-11-25 DIAGNOSIS — M5136 Other intervertebral disc degeneration, lumbar region: Secondary | ICD-10-CM | POA: Diagnosis not present

## 2021-11-26 ENCOUNTER — Ambulatory Visit: Payer: Medicare HMO | Attending: Nurse Practitioner | Admitting: Physical Therapy

## 2021-11-26 DIAGNOSIS — M6281 Muscle weakness (generalized): Secondary | ICD-10-CM | POA: Diagnosis present

## 2021-11-26 DIAGNOSIS — M545 Low back pain, unspecified: Secondary | ICD-10-CM | POA: Insufficient documentation

## 2021-11-26 DIAGNOSIS — M25551 Pain in right hip: Secondary | ICD-10-CM | POA: Insufficient documentation
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
## 2021-11-27 ENCOUNTER — Encounter: Payer: Self-pay | Admitting: Physical Therapy

## 2021-12-09 ENCOUNTER — Ambulatory Visit: Payer: Medicare HMO | Admitting: Physical Therapy
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
## 2021-12-17 ENCOUNTER — Ambulatory Visit: Payer: Medicare HMO | Admitting: Physical Therapy

## 2021-12-17 DIAGNOSIS — M6281 Muscle weakness (generalized): Secondary | ICD-10-CM

## 2021-12-17 DIAGNOSIS — M545 Low back pain, unspecified: Secondary | ICD-10-CM | POA: Diagnosis not present

## 2021-12-17 DIAGNOSIS — M25551 Pain in right hip: Secondary | ICD-10-CM
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
## 2021-12-21 ENCOUNTER — Encounter: Payer: Self-pay | Admitting: Physical Therapy

## 2021-12-22 ENCOUNTER — Ambulatory Visit: Payer: Medicare HMO | Admitting: Physical Therapy

## 2021-12-22 ENCOUNTER — Encounter: Payer: Self-pay | Admitting: Physical Therapy

## 2021-12-22 DIAGNOSIS — M25551 Pain in right hip: Secondary | ICD-10-CM

## 2021-12-22 DIAGNOSIS — M6281 Muscle weakness (generalized): Secondary | ICD-10-CM

## 2021-12-22 DIAGNOSIS — M545 Low back pain, unspecified: Secondary | ICD-10-CM | POA: Diagnosis not present
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
## 2021-12-24 ENCOUNTER — Ambulatory Visit: Payer: Medicare HMO | Attending: Nurse Practitioner | Admitting: Physical Therapy

## 2021-12-24 ENCOUNTER — Encounter: Payer: Self-pay | Admitting: Physical Therapy

## 2021-12-24 DIAGNOSIS — M6281 Muscle weakness (generalized): Secondary | ICD-10-CM | POA: Diagnosis present

## 2021-12-24 DIAGNOSIS — M545 Low back pain, unspecified: Secondary | ICD-10-CM | POA: Diagnosis not present

## 2021-12-24 DIAGNOSIS — M25551 Pain in right hip: Secondary | ICD-10-CM | POA: Insufficient documentation
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
## 2022-01-21 ENCOUNTER — Ambulatory Visit: Payer: Medicare HMO | Admitting: Physical Therapy

## 2022-01-21 ENCOUNTER — Encounter: Payer: Self-pay | Admitting: Physical Therapy

## 2022-01-21 DIAGNOSIS — M545 Low back pain, unspecified: Secondary | ICD-10-CM

## 2022-01-21 DIAGNOSIS — M25551 Pain in right hip: Secondary | ICD-10-CM

## 2022-01-21 DIAGNOSIS — M6281 Muscle weakness (generalized): Secondary | ICD-10-CM

## 2022-01-21 NOTE — Addendum Note (Signed)
Addended by: Consuela Mimes T on: 01/21/2022 10:52 AM   Modules accepted: Orders
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
## 2022-01-28 ENCOUNTER — Ambulatory Visit: Payer: Medicare HMO | Attending: Nurse Practitioner | Admitting: Physical Therapy

## 2022-01-28 DIAGNOSIS — M6281 Muscle weakness (generalized): Secondary | ICD-10-CM | POA: Insufficient documentation

## 2022-01-28 DIAGNOSIS — M545 Low back pain, unspecified: Secondary | ICD-10-CM | POA: Insufficient documentation

## 2022-01-28 DIAGNOSIS — M25551 Pain in right hip: Secondary | ICD-10-CM | POA: Diagnosis present
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
## 2022-01-29 ENCOUNTER — Encounter: Payer: Self-pay | Admitting: Physical Therapy

## 2022-02-04 ENCOUNTER — Ambulatory Visit: Payer: Medicare HMO | Admitting: Physical Therapy

## 2022-02-11 ENCOUNTER — Ambulatory Visit: Payer: Medicare HMO | Admitting: Physical Therapy

## 2022-02-11 DIAGNOSIS — M6281 Muscle weakness (generalized): Secondary | ICD-10-CM

## 2022-02-11 DIAGNOSIS — M25551 Pain in right hip: Secondary | ICD-10-CM

## 2022-02-11 DIAGNOSIS — M545 Low back pain, unspecified: Secondary | ICD-10-CM | POA: Diagnosis not present
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
## 2022-02-12 ENCOUNTER — Encounter: Payer: Self-pay | Admitting: Physical Therapy

## 2022-02-18 ENCOUNTER — Ambulatory Visit: Payer: Medicare HMO | Admitting: Physical Therapy

## 2022-02-18 DIAGNOSIS — M545 Low back pain, unspecified: Secondary | ICD-10-CM

## 2022-02-18 DIAGNOSIS — M25551 Pain in right hip: Secondary | ICD-10-CM

## 2022-02-18 DIAGNOSIS — M6281 Muscle weakness (generalized): Secondary | ICD-10-CM
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
## 2022-02-21 ENCOUNTER — Encounter: Payer: Self-pay | Admitting: Physical Therapy
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
## 2022-03-23 ENCOUNTER — Ambulatory Visit: Payer: Medicare HMO | Admitting: Orthopedic Surgery

## 2022-03-24 ENCOUNTER — Ambulatory Visit (INDEPENDENT_AMBULATORY_CARE_PROVIDER_SITE_OTHER): Payer: Medicare HMO | Admitting: Orthopedic Surgery

## 2022-03-24 ENCOUNTER — Encounter: Payer: Self-pay | Admitting: Orthopedic Surgery

## 2022-03-24 VITALS — BP 122/78 | Ht 69.0 in | Wt 209.4 lb

## 2022-03-24 DIAGNOSIS — M5136 Other intervertebral disc degeneration, lumbar region: Secondary | ICD-10-CM

## 2022-03-24 DIAGNOSIS — Z9889 Other specified postprocedural states: Secondary | ICD-10-CM | POA: Diagnosis not present

## 2022-03-24 NOTE — Patient Instructions (Signed)
It was so nice to see you today. Thank you so much for coming in.    You have some wear and tear in your back (degenerative disc disease at L2-L3 and likely some arthritis in your lower back). I think this is what is causing your pain. Your fibromyalgia is likely making this pain worse.   Continue to work on the exercises they showed you in PT.   Okay to take over the counter motrin as needed for pain. Take as directed on the bottle and take with food.   If your pain gets worse, we can consider a CT scan of your lower back and then consider possible injections.   Take a picture of your face when you feel the plates bulging out. Call us and we can give you an email address to send it too so we can see it.   We will leave your follow up with me open, but call me if you need anything.   Geronimo Boot PA-C [phone]
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
## 2022-03-25 ENCOUNTER — Ambulatory Visit: Payer: Medicare HMO | Admitting: Orthopedic Surgery

## 2022-03-26 ENCOUNTER — Ambulatory Visit: Payer: Medicare HMO | Admitting: Orthopedic Surgery

## 2022-05-05 ENCOUNTER — Telehealth: Payer: Self-pay

## 2022-05-05 DIAGNOSIS — M5136 Other intervertebral disc degeneration, lumbar region: Secondary | ICD-10-CM

## 2022-05-05 NOTE — Telephone Encounter (Signed)
Noted  

## 2022-05-05 NOTE — Telephone Encounter (Signed)
I ordered lumbar CT. 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   Not sure where she wants CT done.

## 2022-05-05 NOTE — Telephone Encounter (Signed)
-----   Message from Peggyann Shoals sent at 05/05/2022 10:33 AM EDT ----- Regarding: order CT Contact: [phone] Patient seen Anna Ayala back in January for back pain. The pain is getting worse and now ibuprofen is not touching it. She would like to move forward with the CT scan, her insurance is correct in her chart.

## 2022-05-14 ENCOUNTER — Ambulatory Visit
Admission: RE | Admit: 2022-05-14 | Discharge: 2022-05-14 | Disposition: A | Discharge status: Home / Self Care | Payer: Medicare HMO | Source: Ambulatory Visit | Attending: Orthopedic Surgery | Admitting: Orthopedic Surgery

## 2022-05-14 DIAGNOSIS — X58XXXD Exposure to other specified factors, subsequent encounter: Secondary | ICD-10-CM | POA: Diagnosis not present

## 2022-05-14 DIAGNOSIS — M5136 Other intervertebral disc degeneration, lumbar region: Secondary | ICD-10-CM | POA: Diagnosis not present

## 2022-05-14 DIAGNOSIS — M47816 Spondylosis without myelopathy or radiculopathy, lumbar region: Secondary | ICD-10-CM | POA: Insufficient documentation

## 2022-05-14 DIAGNOSIS — M549 Dorsalgia, unspecified: Secondary | ICD-10-CM | POA: Insufficient documentation

## 2022-05-18 ENCOUNTER — Telehealth: Payer: Medicare HMO | Admitting: Orthopedic Surgery
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
## 2022-05-20 ENCOUNTER — Ambulatory Visit (INDEPENDENT_AMBULATORY_CARE_PROVIDER_SITE_OTHER): Payer: Medicare HMO | Admitting: Orthopedic Surgery

## 2022-05-20 ENCOUNTER — Ambulatory Visit: Payer: Medicare HMO | Admitting: Orthopedic Surgery

## 2022-05-20 ENCOUNTER — Encounter: Payer: Self-pay | Admitting: Orthopedic Surgery

## 2022-05-20 VITALS — BP 124/77 | HR 92 | Ht 69.0 in | Wt 204.0 lb

## 2022-05-20 DIAGNOSIS — M5136 Other intervertebral disc degeneration, lumbar region: Secondary | ICD-10-CM | POA: Diagnosis not present

## 2022-05-20 DIAGNOSIS — M47816 Spondylosis without myelopathy or radiculopathy, lumbar region: Secondary | ICD-10-CM | POA: Diagnosis not present

## 2022-05-20 NOTE — Patient Instructions (Signed)
It was so nice to see you today. Thank you so much for coming in.    You have some wear and tear in your back with moderate/severe degeneration of disc at L2-L3. Also with some degeneration at L3-S1 as well.   Okay to get back to water aerobics. Okay to continue with your exercises from PT.   If pain gets worse, we can revisit spine injections.   We will leave your follow up open.   Please do not hesitate to call if you have any questions or concerns. You can also message me in [place].    Geronimo Boot PA-C [phone]

## 2022-07-08 IMAGING — CR DG CHEST 2V
2 series · 2 of 2 positions shown · non-contrast
Comparison: October 12, 2015

CLINICAL DATA: Cough and fever

EXAM:
CHEST - 2 VIEW

[chest pa]
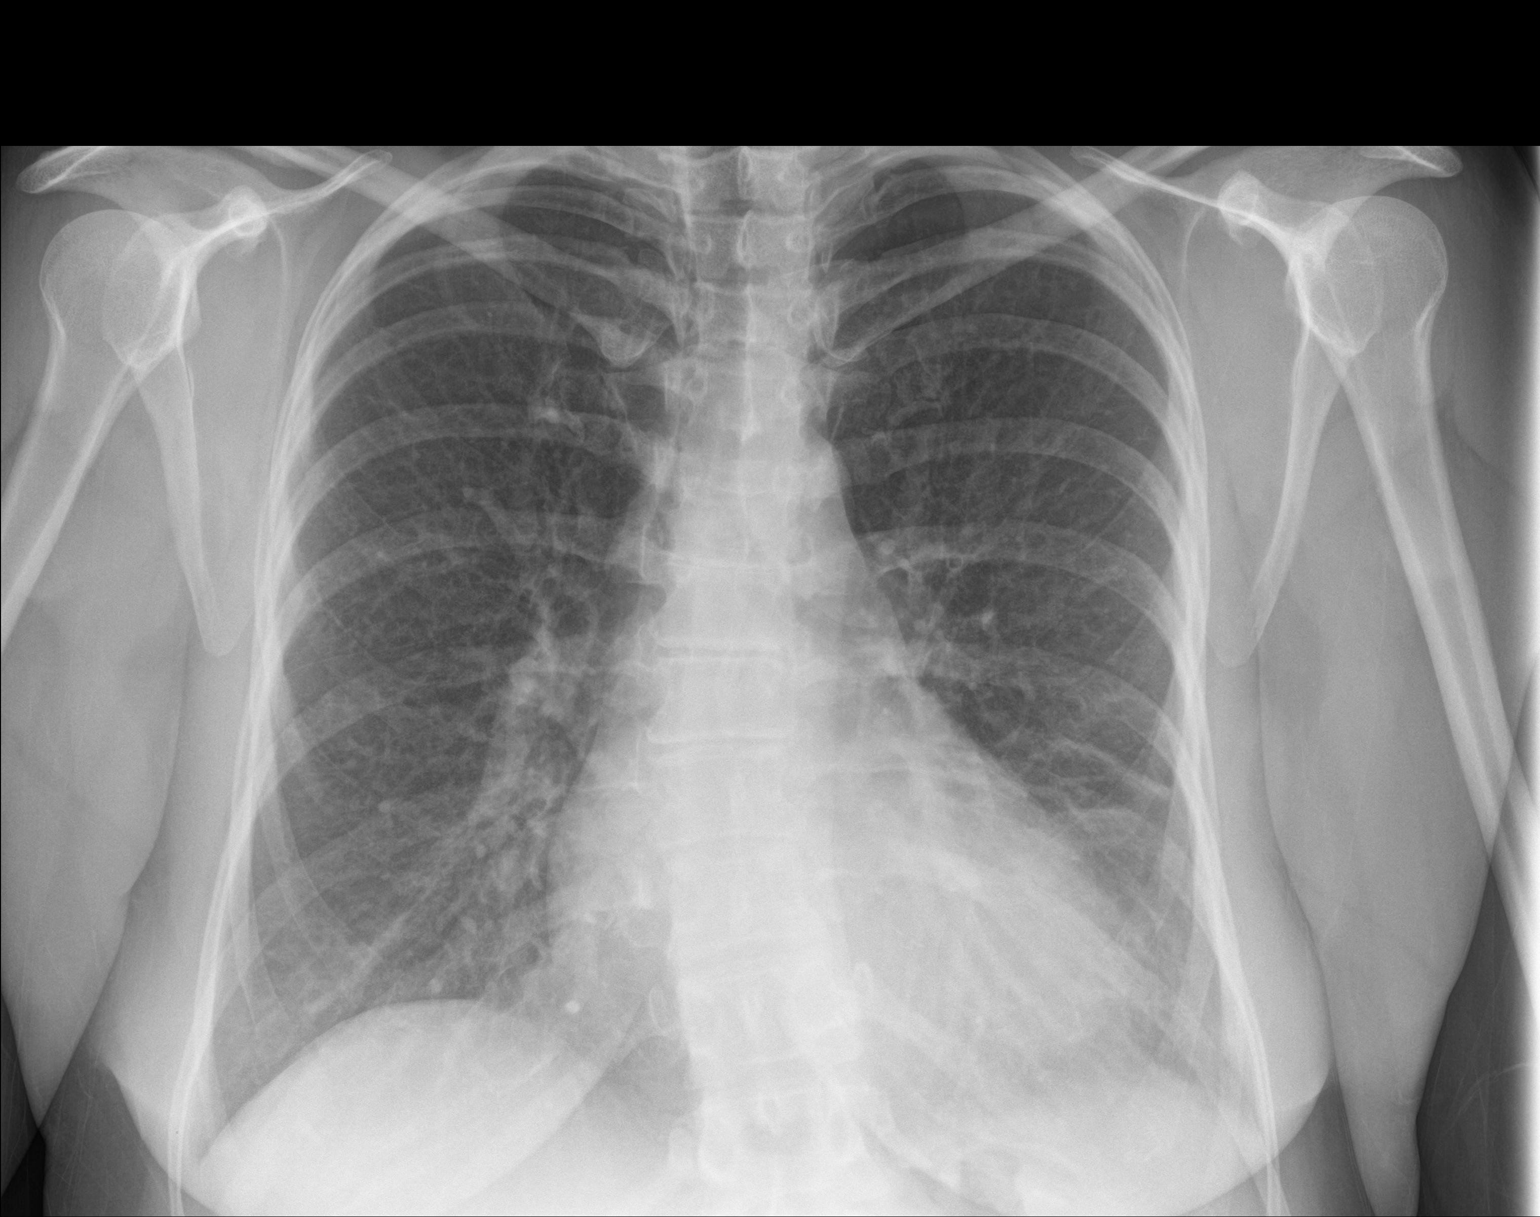

[chest lat]
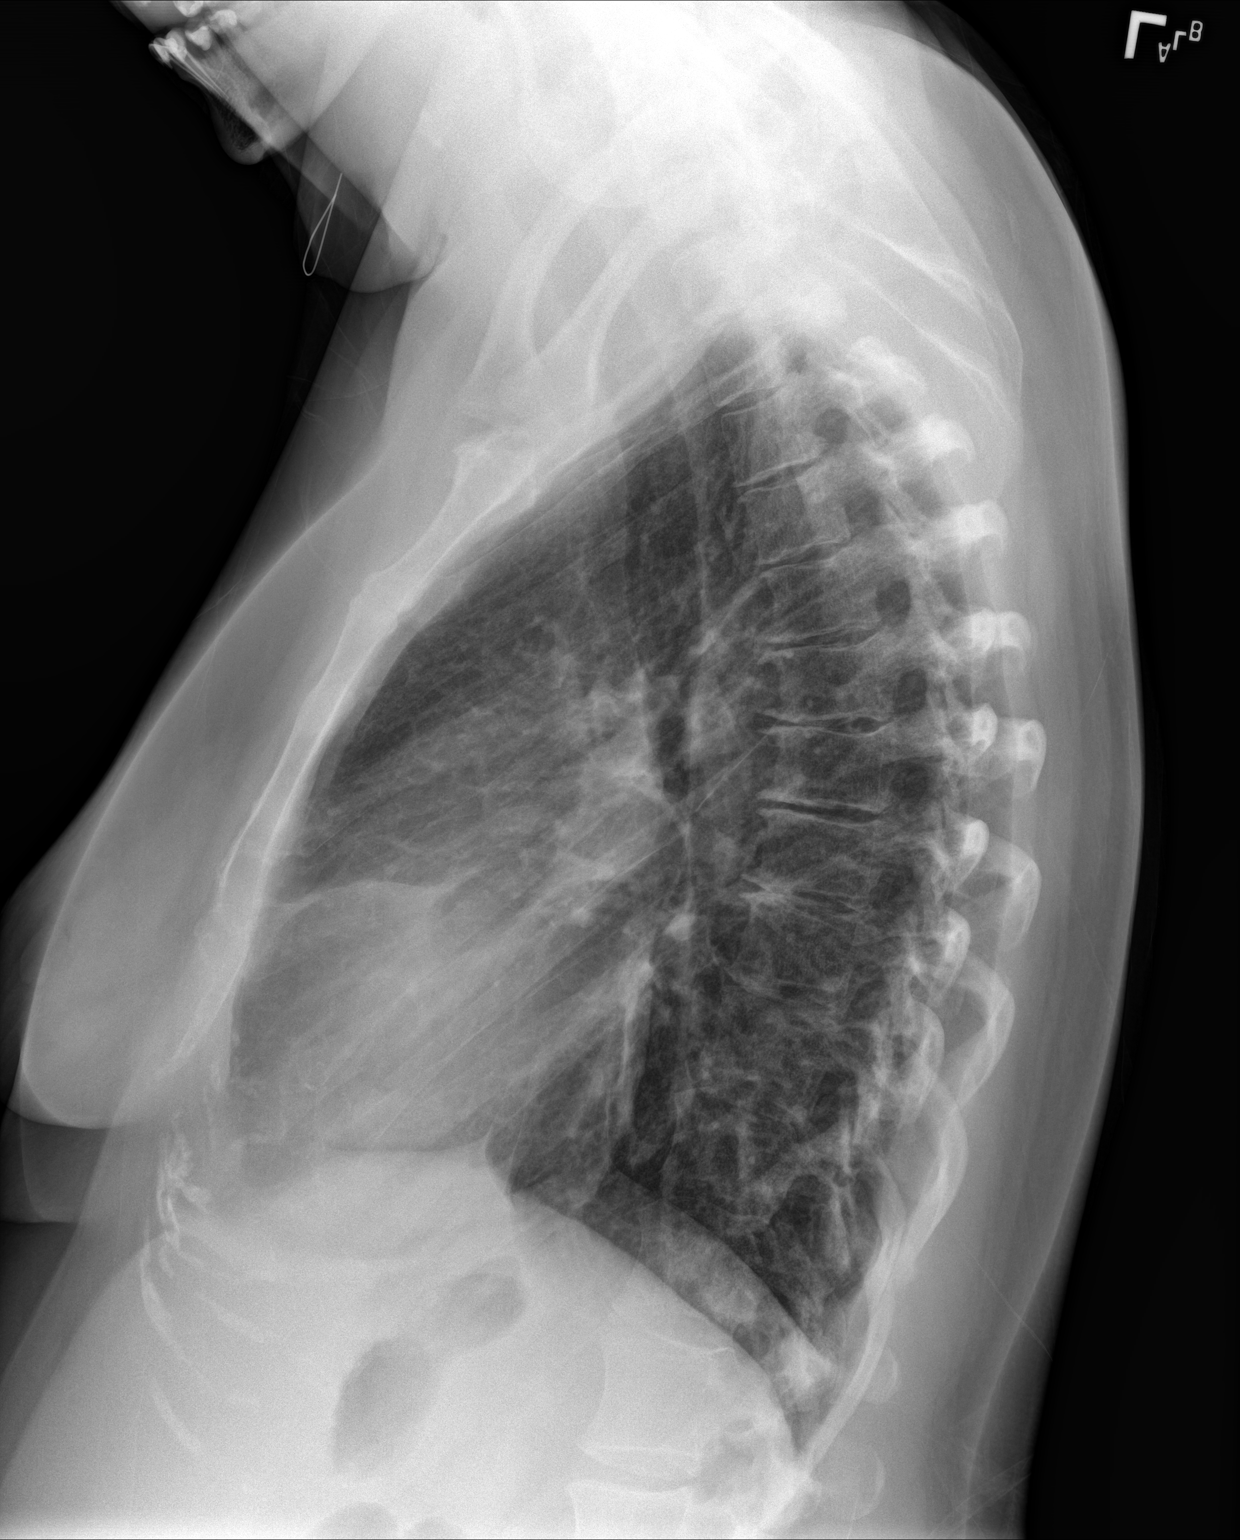

[2 of 2 positions shown; findings below may reference images not displayed]

FINDINGS: There is atelectatic change in the lingula. The lungs elsewhere are
clear. Heart size and pulmonary vascularity are normal. No
adenopathy. There is thoracolumbar levoscoliosis.
IMPRESSION: Lingular atelectasis. Lungs elsewhere clear. Cardiac silhouette
within normal limits.

## 2022-12-31 ENCOUNTER — Telehealth: Payer: Self-pay | Admitting: Orthopedic Surgery

## 2022-12-31 NOTE — Telephone Encounter (Signed)
She saw her PCP yesterday and they think tingling in her toes is from peripheral neuropathy which we do not treat.   I am happy to see her, but she may want to discuss further with PCP and/or see neurology.

## 2022-12-31 NOTE — Telephone Encounter (Signed)
V/m not set up, unable to leave a message. I will try her tomorrow.

## 2022-12-31 NOTE — Telephone Encounter (Signed)
Patient has called stating that she is having tingling in her toes. It is a sharp pain that comes and goes. She remembers in her last appointment Stacy mentioning trying [facility]. Patient is wondering should she come back to have a follow up with [facility] first or should she just try ortho? Please advise.

## 2022-12-31 NOTE — Telephone Encounter (Signed)
Spoke with patient and let her know what was suggested of our office- she was a little unsure as to what neurology could do. Advised the patient that if she would like a further explanation, Anna Ayala would be happy to see her. Patient decided to go with orthopedics to see what they suggest and go from there.

## 2023-01-01 NOTE — Telephone Encounter (Signed)
She called back this morning and has decided to see you next week.
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
## 2023-01-07 ENCOUNTER — Ambulatory Visit: Payer: Medicare HMO | Admitting: Orthopedic Surgery

## 2023-01-07 ENCOUNTER — Encounter: Payer: Self-pay | Admitting: Orthopedic Surgery

## 2023-01-07 VITALS — BP 118/78 | Ht 69.0 in | Wt 204.0 lb

## 2023-01-07 DIAGNOSIS — R200 Anesthesia of skin: Secondary | ICD-10-CM | POA: Diagnosis not present

## 2023-01-07 DIAGNOSIS — R202 Paresthesia of skin: Secondary | ICD-10-CM

## 2023-01-07 DIAGNOSIS — M5136 Other intervertebral disc degeneration, lumbar region with discogenic back pain only: Secondary | ICD-10-CM | POA: Diagnosis not present

## 2023-01-07 DIAGNOSIS — M47816 Spondylosis without myelopathy or radiculopathy, lumbar region: Secondary | ICD-10-CM

## 2023-01-07 NOTE — Patient Instructions (Addendum)
It was so nice to see you today. Thank you so much for coming in.    You have some wear and tear in your back with moderate/severe degeneration of disc at L2-L3. Also with some degeneration at L3-S1 as well.   Okay to get back to water aerobics. Okay to continue with your exercises from PT.   I think the numbness/tingling and pain in her feet is likely from neuropathy. I want to get an EMG (nerve conduction test) to look into things further. I have ordered this and [facility] will call you to schedule. You can also call them at [phone].   Once I get the results back, we will set up a phone visit.   Please do not hesitate to call if you have any questions or concerns. You can also message me in MyChart.    Drake Leach PA-C [phone]

## 2023-01-12 ENCOUNTER — Encounter: Payer: Self-pay | Admitting: Neurology

## 2023-01-12 ENCOUNTER — Other Ambulatory Visit: Payer: Self-pay

## 2023-01-12 DIAGNOSIS — R202 Paresthesia of skin: Secondary | ICD-10-CM

## 2023-02-08 ENCOUNTER — Ambulatory Visit (INDEPENDENT_AMBULATORY_CARE_PROVIDER_SITE_OTHER): Payer: Medicare HMO | Admitting: Neurology

## 2023-02-08 DIAGNOSIS — M5417 Radiculopathy, lumbosacral region: Secondary | ICD-10-CM

## 2023-02-08 DIAGNOSIS — R202 Paresthesia of skin: Secondary | ICD-10-CM

## 2023-02-08 NOTE — Procedures (Signed)
[facility]  [address], [address]  [place], [place] [address] Tel: [phone] Fax: [phone] Test Date:  02/08/2023  Patient: Towanna Koelbl DOB: June 23, 1975 Physician: Jacquelyne Balint, MD  Sex: Female Height: 5\' 9"  Ref Phys: Drake Leach, PA-C  ID#: [id number]   Technician:    History: This is a 47 year old female with numbness and tingling in feet.  NCV & EMG Findings: Extensive electrodiagnostic evaluation of bilateral lower limbs shows: Bilateral sural and superficial peroneal/fibular sensory responses are within normal limits. Right peroneal/fibular (EDB) motor response is absent. Left peroneal/fibular (EDB) motor response shows reduced amplitude (1.98 mV). Left peroneal/fibular (TA) motor response shows reduced amplitude (3.1 mV). Right peroneal/fibular (TA) and bilateral tibial (AH) motor responses are within normal limits. Bilateral H reflex responses are absent. Chronic motor axon loss changes without accompanying active denervation changes are seen in bilateral tibialis anterior, bilateral flexor digitorum longus, bilateral gluteus medius, right medial head of gastrocnemius, right short head of biceps femoris, and right lumbosacral paraspinal (L5 level) muscles.  Impression: This is an abnormal study. The findings are most consistent with the following: The residuals of old intraspinal canal lesion(s) (ie: motor radiculopathy) at the right L5 and S1 and left L5 roots or segments, moderate in degree electrically at bilateral L5 and mild in degree electrically at right S1. No electrodiagnostic evidence of a large fiber sensorimotor neuropathy.    ___________________________ Jacquelyne Balint, MD    Nerve Conduction Studies Motor Nerve Results    Latency Amplitude F-Lat Segment Distance CV Comment  Site (ms) Norm (mV) Norm (ms)  (cm) (m/s) Norm   Left Fibular (EDB) Motor  Ankle 4.7  < 5.5 *1.98  > 3.0        Bel fib head 12.7 - 1.58 -  Bel fib head-Ankle 32.5 41   > 40   Pop fossa 14.5 - 1.55 -  Pop fossa-Bel fib head 9 50 -   Right Fibular (EDB) Motor  Ankle *NR  < 5.5 *NR  > 3.0        Bel fib head *NR - *NR -  Bel fib head-Ankle - *NR  > 40   Pop fossa *NR - *NR -  Pop fossa-Bel fib head - *NR -   Left Fibular (TA) Motor  Fib head 2.1  < 4.0 *3.1  > 4.0        Pop fossa 3.5  < 6.7 2.8 -  Pop fossa-Fib head 9 64  > 40   Right Fibular (TA) Motor  Fib head 2.0  < 4.0 4.3  > 4.0        Pop fossa 3.5  < 6.7 4.2 -  Pop fossa-Fib head 10 67  > 40   Left Tibial (AH) Motor  Ankle 3.8  < 6.0 9.5  > 8.0        Knee 13.9 - 6.8 -  Knee-Ankle 44 44  > 40   Right Tibial (AH) Motor  Ankle 4.2  < 6.0 10.6  > 8.0        Knee 14.1 - 8.3 -  Knee-Ankle 41 41  > 40    Sensory Sites    Neg Peak Lat Amplitude (O-P) Segment Distance Velocity Comment  Site (ms) Norm (V) Norm  (cm) (ms)   Left Superficial Fibular Sensory  14 cm-Ankle 2.7  < 4.5 5  > 5 14 cm-Ankle 14    Right Superficial Fibular Sensory  14 cm-Ankle 2.9  < 4.5 9  >  5 14 cm-Ankle 14    Left Sural Sensory  Calf-Lat mall 4.0  < 4.5 8  > 5 Calf-Lat mall 14    Right Sural Sensory  Calf-Lat mall 3.7  < 4.5 7  > 5 Calf-Lat mall 14     H-Reflex Results    M-Lat H Lat H Neg Amp H-M Lat  Site (ms) (ms) Norm (mV) (ms)  Left Tibial H-Reflex  Pop fossa 6.4 -  < 35.0 - -  Right Tibial H-Reflex  Pop fossa 6.1 -  < 35.0 - -   Electromyography   Side Muscle Ins.Act Fibs Fasc Recrt Amp Dur Poly Activation Comment  Left Tib ant Nml Nml Nml *1- *1+ *1+ Nml Nml N/A  Left Gastroc MH Nml Nml Nml Nml Nml Nml Nml Nml N/A  Left FDL Nml Nml Nml *1- *1+ *1+ Nml Nml N/A  Left Rectus fem Nml Nml Nml Nml Nml Nml Nml Nml N/A  Left Biceps fem SH Nml Nml Nml Nml Nml Nml Nml Nml N/A  Left Gluteus med Nml Nml Nml *1- *1+ *1+ Nml Nml N/A  Left Lumbar PSP lower Nml Nml Nml Nml Nml Nml Nml Nml N/A  Right Tib ant Nml Nml Nml *2- *1+ *1+ Nml Nml N/A  Right Gastroc MH Nml Nml Nml *1- *1+ *1+ Nml Nml N/A  Right FDL Nml  Nml Nml *2- *1+ *1+ Nml Nml N/A  Right Rectus fem Nml Nml Nml Nml Nml Nml Nml Nml N/A  Right Biceps fem SH Nml Nml Nml *1- *1+ *1+ Nml Nml N/A  Right Gluteus med Nml Nml Nml *2- *1+ *1+ Nml Nml N/A  Right Lumbar PSP lower Nml Nml Nml *1- *1+ *1+ Nml Nml N/A      Waveforms:  Motor               Sensory           H-Reflex
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
## 2023-02-23 ENCOUNTER — Ambulatory Visit (INDEPENDENT_AMBULATORY_CARE_PROVIDER_SITE_OTHER): Payer: Medicare HMO | Admitting: Orthopedic Surgery

## 2023-02-23 ENCOUNTER — Encounter: Payer: Self-pay | Admitting: Orthopedic Surgery

## 2023-02-23 DIAGNOSIS — M4726 Other spondylosis with radiculopathy, lumbar region: Secondary | ICD-10-CM

## 2023-02-23 DIAGNOSIS — M5136 Other intervertebral disc degeneration, lumbar region with discogenic back pain only: Secondary | ICD-10-CM

## 2023-02-23 DIAGNOSIS — M5416 Radiculopathy, lumbar region: Secondary | ICD-10-CM

## 2023-02-23 DIAGNOSIS — M47816 Spondylosis without myelopathy or radiculopathy, lumbar region: Secondary | ICD-10-CM

## 2023-03-01 ENCOUNTER — Ambulatory Visit
Admission: RE | Admit: 2023-03-01 | Discharge: 2023-03-01 | Disposition: A | Discharge status: Home / Self Care | Payer: Medicare HMO | Source: Ambulatory Visit | Attending: Family Medicine | Admitting: Family Medicine

## 2023-03-01 VITALS — BP 145/91 | HR 89 | Temp 98.7°F

## 2023-03-01 DIAGNOSIS — J441 Chronic obstructive pulmonary disease with (acute) exacerbation: Secondary | ICD-10-CM

## 2023-03-01 MED ORDER — AZITHROMYCIN 250 MG PO TABS: ORAL_TABLET | ORAL | 0 refills | Status: AC

## 2023-03-01 MED ORDER — PROMETHAZINE-DM 6.25-15 MG/5ML PO SYRP: 5.0000 mL | ORAL_SOLUTION | Freq: Four times a day (QID) | ORAL | 0 refills | Status: AC | PRN

## 2023-03-01 MED ORDER — PREDNISONE 50 MG PO TABS: 50.0000 mg | ORAL_TABLET | Freq: Every day | ORAL | 0 refills | Status: AC

## 2023-03-01 NOTE — ED Provider Notes (Signed)
 [facility]    CSN: [id number] Arrival date & time: 03/01/23  1846      History   Chief Complaint Chief Complaint  Patient presents with   Headache   Nasal Congestion   Cough    HPI Anna Ayala is a 48 y.o. female.   HPI   Anna Ayala presents for Im sick.  Has nasal congestion, headache and productive cough for over a week.  Has COPD. She has been wheezing. Has been using her inhalers.  Has been feeling warm but didn't take her temperature.  Taking Nyquil, nasal decongestant, Theraflu. No known sick contacts.       Past Medical History:  Diagnosis Date   Aneurysm of anterior cerebral artery    Anxiety    Ataxia    COPD (chronic obstructive pulmonary disease) (HCC)    Depression    Dysarthria    Fibromyalgia    GERD (gastroesophageal reflux disease)    Head ache    Hypertension    Myalgia and myositis    Seizure (HCC)    Seizure-like activity per neuro   Stroke (HCC)    TIAs per pt. x6 last in2020. Some Right sided weakness   Tobacco abuse     There are no active problems to display for this patient.   Past Surgical History:  Procedure Laterality Date   BRAIN SURGERY     BROW LIFT Bilateral 05/29/2021   Procedure: BLEPHAROPLASTY UPPER EYLEID; W/EXCESS SKIN BLEPHAROPTOSIS REPAIR; RESECT EX BILATERAL;  Surgeon: Ashley Greig HERO, Ayala;  Location: [facility];  Service: Ophthalmology;  Laterality: Bilateral;  Diabetic   ROBOTIC ASSISTED TOTAL HYSTERECTOMY WITH SACROCOLPOPEXY     supracervical hyst, with posterior repair    OB History     Gravida  4   Para      Term      Preterm      AB  2   Living  2      SAB  2   IAB      Ectopic      Multiple      Live Births  2            Home Medications    Prior to Admission medications   Medication Sig Start Date End Date Taking? Authorizing Provider  azithromycin  (ZITHROMAX  Z-PAK) 250 MG tablet Take 2 tablets on day 1 then 1 tablet daily 03/01/23  Yes Anna Ayala   predniSONE  (DELTASONE ) 50 MG tablet Take 1 tablet (50 mg total) by mouth daily for 5 days. 03/01/23 03/06/23 Yes Anna Ayala  promethazine -dextromethorphan (PROMETHAZINE -DM) 6.25-15 MG/5ML syrup Take 5 mLs by mouth 4 (four) times daily as needed. 03/01/23  Yes Anna Ayala  albuterol  (PROVENTIL ) (2.5 MG/3ML) 0.083% nebulizer solution Take 2.5 mg by nebulization every 6 (six) hours as needed for wheezing or shortness of breath.    Provider, Historical, Ayala  albuterol  (VENTOLIN  HFA) 108 (90 Base) MCG/ACT inhaler Inhale into the lungs every 6 (six) hours as needed for wheezing or shortness of breath.    Provider, Historical, Ayala  aspirin 325 MG tablet Take 325 mg by mouth daily.    Provider, Historical, Ayala  cetirizine (ZYRTEC) 10 MG tablet Take 10 mg by mouth daily.    Provider, Historical, Ayala  fluticasone (FLONASE) 50 MCG/ACT nasal spray Place 1 spray into both nostrils 2 (two) times daily.    Provider, Historical, Ayala  fluticasone-salmeterol (ADVAIR) 500-50 MCG/ACT AEPB Inhale 1 puff into the lungs  in the morning and at bedtime.    Provider, Historical, Ayala  furosemide (LASIX) 40 MG tablet Take 40 mg by mouth.    Provider, Historical, Ayala  gabapentin (NEURONTIN) 300 MG capsule Take 300 mg by mouth 3 (three) times daily.    Provider, Historical, Ayala  ketoconazole (NIZORAL) 2 % cream Apply 1 application topically daily.    Provider, Historical, Ayala  lamoTRIgine (LAMICTAL) 25 MG tablet Take by mouth 2 (two) times daily. 200 mg AM, 250 mg PM 08/03/20   Provider, Historical, Ayala  lisinopril (ZESTRIL) 20 MG tablet Take 20 mg by mouth daily.    Provider, Historical, Ayala  metFORMIN (GLUCOPHAGE) 500 MG tablet Take 500 mg by mouth daily with supper.    Provider, Historical, Ayala  methocarbamol (ROBAXIN) 500 MG tablet Take 1-2 tablets by mouth 3 (three) times daily as needed. 06/23/20   Provider, Historical, Ayala  mirabegron  ER (MYRBETRIQ ) 25 MG TB24 tablet Take 1 tablet (25 mg total) by mouth daily. 06/27/21    Anna Ayala  pantoprazole (PROTONIX) 40 MG tablet Take 80 mg by mouth daily.    Provider, Historical, Ayala  pregabalin (LYRICA) 100 MG capsule Take 150 mg by mouth 2 (two) times daily. 150 mg AM, 300 mg PM    Provider, Historical, Ayala  propranolol (INDERAL) 20 MG tablet Take 40 mg by mouth 2 (two) times daily.    Provider, Historical, Ayala  QUEtiapine (SEROQUEL) 50 MG tablet SMARTSIG:4 Tablet(s) By Mouth Every Night PRN 08/03/20   Provider, Historical, Ayala  sertraline (ZOLOFT) 25 MG tablet Take 25 mg by mouth daily.    Provider, Historical, Ayala  topiramate (TOPAMAX) 25 MG tablet Take 25 mg by mouth. 11/19/22   Provider, Historical, Ayala    Family History Family History  Adopted: Yes    Social History Social History   Tobacco Use   Smoking status: Every Day    Current packs/day: 1.50    Average packs/day: 1.5 packs/day for 34.0 years (51.0 ttl pk-yrs)    Types: Cigarettes   Smokeless tobacco: Never   Tobacco comments:    Started smoking around age 14  Vaping Use   Vaping status: Never Used  Substance Use Topics   Alcohol use: No   Drug use: Not Currently     Allergies   Bee venom, Chantix [varenicline], Spiriva handihaler [tiotropium bromide monohydrate], Tramadol, Diclofenac sodium, Codeine, Diclofenac, Quetiapine, and Tizanidine   Review of Systems Review of Systems: negative unless otherwise stated in HPI.      Physical Exam Triage Vital Signs ED Triage Vitals  Encounter Vitals Group     BP 03/01/23 1907 (!) 145/91     Systolic BP Percentile --      Diastolic BP Percentile --      Pulse Rate 03/01/23 1907 89     Resp --      Temp 03/01/23 1907 98.7 F (37.1 C)     Temp Source 03/01/23 1907 Oral     SpO2 03/01/23 1907 92 %     Weight --      Height --      Head Circumference --      Peak Flow --      Pain Score 03/01/23 1906 7     Pain Loc --      Pain Education --      Exclude from Growth Chart --    No data found.  Updated Vital Signs BP (!)  145/91 (BP Location: Right Arm)  Pulse 89   Temp 98.7 F (37.1 C) (Oral)   LMP 09/16/2016   SpO2 92%   Visual Acuity Right Eye Distance:   Left Eye Distance:   Bilateral Distance:    Right Eye Near:   Left Eye Near:    Bilateral Near:     Physical Exam GEN:     alert, female and no distress    HENT:  mucus membranes moist, oropharyngeal without lesions or erythema no nasal discharge  EYES:   pupils equal and reactive, EOM intact RESP:  clear to auscultation bilaterally, coarse breath sounds bilaterally CVS:   regular rate and rhythm NEURO:  normal without focal findings,  speech normal, alert and oriented  Skin:   warm and dry      UC Treatments / Results  Labs (all labs ordered are listed, but only abnormal results are displayed) Labs Reviewed - No data to display  EKG  If EKG performed, see my interpretation in the MDM section  Radiology No results found.   Procedures Procedures (including critical care time)  Medications Ordered in UC Medications - No data to display  Initial Impression / Assessment and Plan / UC Course  I have reviewed the triage vital signs and the nursing notes.  Pertinent labs & imaging results that were available during my care of the patient were reviewed by me and considered in my medical decision making (see chart for details).       Patient is a 48 y.o. female  who presents for respiratory symptoms for the past week.  He has COPD.  Overall patient is nontoxic-appearing and afebrile.  Vital signs stable.  -Treat COPD exacerbation including prednisone  50 mg/day for the next 5 days with azithromycin  500 mg on day 1 followed by 250 mg for the next 4 days -Promethazine  DM for cough. - Continue home medications.  -Provided return precautions to patient that if symptoms worsen, if shortness of breath increases, if pt develops chest pain, high fevers, or other concerning symptoms to have a low threshold to go to the emergency department   -Recommended patient follow-up with PCP if symptoms Ayala not improve over the next 1 week   Final Clinical Impressions(s) / UC Diagnoses   Final diagnoses:  COPD exacerbation [facility])     Discharge Instructions      Stop by the pharmacy to pick up your prescriptions.  Follow up with your primary care provider as needed.      ED Prescriptions     Medication Sig Dispense Auth. Provider   azithromycin  (ZITHROMAX  Z-PAK) 250 MG tablet Take 2 tablets on day 1 then 1 tablet daily 6 tablet Keeshia Sanderlin, Ayala   predniSONE  (DELTASONE ) 50 MG tablet Take 1 tablet (50 mg total) by mouth daily for 5 days. 5 tablet Cionna Collantes, Ayala   promethazine -dextromethorphan (PROMETHAZINE -DM) 6.25-15 MG/5ML syrup Take 5 mLs by mouth 4 (four) times daily as needed. 118 mL Leisl Spurrier, Ayala      PDMP not reviewed this encounter.   Kriste Berth, Ayala 03/05/23 1757

## 2023-03-01 NOTE — Discharge Instructions (Signed)
 Stop by the pharmacy to pick up your prescriptions.  Follow up with your primary care provider as needed.

## 2023-03-01 NOTE — ED Triage Notes (Signed)
 Pt presents with a cough, headache and runny nose x 1 week. Pt has taken OTC cold medication for her symptoms.

## 2023-03-11 NOTE — Progress Notes (Signed)
Patient for DG Lumbar Myelogram Inj/CT Lumbar Myelogram on Friday 03/12/2023, I called and spoke with the patient on the phone and gave pre-procedure instructions. Pt was made aware to be here at 9:30a and check in at the new entrance. Pt stated understanding.  Called 03/11/2023

## 2023-03-12 ENCOUNTER — Telehealth: Payer: Self-pay | Admitting: Orthopedic Surgery

## 2023-03-12 ENCOUNTER — Ambulatory Visit
Admission: RE | Admit: 2023-03-12 | Discharge: 2023-03-12 | Disposition: A | Discharge status: Home / Self Care | Payer: Medicare HMO | Source: Ambulatory Visit | Attending: Orthopedic Surgery | Admitting: Orthopedic Surgery

## 2023-03-12 ENCOUNTER — Encounter: Payer: Self-pay | Admitting: Orthopedic Surgery

## 2023-03-12 DIAGNOSIS — Y92410 Unspecified street and highway as the place of occurrence of the external cause: Secondary | ICD-10-CM | POA: Insufficient documentation

## 2023-03-12 DIAGNOSIS — M47816 Spondylosis without myelopathy or radiculopathy, lumbar region: Secondary | ICD-10-CM

## 2023-03-12 DIAGNOSIS — M5416 Radiculopathy, lumbar region: Secondary | ICD-10-CM

## 2023-03-12 DIAGNOSIS — M5136 Other intervertebral disc degeneration, lumbar region with discogenic back pain only: Secondary | ICD-10-CM

## 2023-03-12 DIAGNOSIS — M546 Pain in thoracic spine: Secondary | ICD-10-CM

## 2023-03-12 DIAGNOSIS — M545 Low back pain, unspecified: Secondary | ICD-10-CM | POA: Diagnosis present

## 2023-03-12 MED ORDER — LIDOCAINE HCL (PF) 1 % IJ SOLN
10.0000 mL | Freq: Once | INTRAMUSCULAR | Status: AC
  Administered 2023-03-12: 15 mL via INTRADERMAL
  Filled 2023-03-12: qty 10

## 2023-03-12 MED ORDER — IBUPROFEN 800 MG PO TABS
800.0000 mg | ORAL_TABLET | Freq: Once | ORAL | Status: DC
Start: 2023-03-12 — End: 2023-03-13
  Filled 2023-03-12: qty 1

## 2023-03-12 MED ORDER — IOHEXOL 180 MG/ML  SOLN
20.0000 mL | Freq: Once | INTRAMUSCULAR | Status: AC | PRN
  Administered 2023-03-12: 20 mL via INTRATHECAL

## 2023-03-12 NOTE — Telephone Encounter (Signed)
Reviewed thoracic CT results with Dr. Myer Haff. Nothing emergent to be done, but will likely refer her to vascular neurosurgery Marolyn Haller at [place] or [place] at [facility]). 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

## 2023-03-12 NOTE — Progress Notes (Signed)
Message sent to J. Covington PA about head and back pain. Pt reports she takes 800 mg ibuprofen po prn but hasn't taken any pain meds today.

## 2023-03-12 NOTE — Progress Notes (Addendum)
CT myelogram of lumbar spine dated 03/12/23:  FINDINGS: Segmentation: 5 lumbar vertebrae. The caudal most well-formed intervertebral disc space is designated L5-S1.   Alignment: Levocurvature of the lumbar and fracture or aggressive osseous lesion. No significant spondylolisthesis.   Vertebrae: No lumbar vertebral compression fracture. Prominent degenerative endplate sclerosis to the right at L2-L3.   Spinal canal, spinal cord and cauda equina nerve roots: There prominent vessels along the visualized distal spinal cord (for instance as seen on series 6, images 30-34). The visualized distal spinal cord is normal in caliber and contour. The conus terminates at the L1-L2 level.   Paraspinal and other soft tissues: Aortoiliac atherosclerosis. No acute finding within included portions of the abdomen/retroperitoneum. No paraspinal mass or collection.   Disc levels:   Advanced disc space narrowing to the right at L2-L3 (at the apex of the thoracolumbar levoscoliosis. No more than mild disc space narrowing at the remaining lumbar or visualized lower thoracic levels.   T11-T12: No significant disc herniation, spinal canal stenosis or neural foraminal narrowing.   T12-L1: Mild facet arthropathy. No significant disc herniation, spinal canal stenosis or neural foraminal narrowing.   L1-L2: Small disc bulge. Mild facet arthropathy. No significant spinal canal or foraminal stenosis.   L2-L3: Disc bulge asymmetric to the right with disc osteophyte ridge extending into the right neural foramen, and endplate osteophytes along the right aspect of the disc space. The disc bulge contributes to right subarticular stenosis with potential to affect descending right-sided nerve roots (most notably the descending right L3 nerve root). Mild central canal stenosis. Multifactorial moderate right neural foraminal narrowing with potential to affect the exiting right L2 nerve root (series 8, image 29).    L3-L4: Mild facet arthropathy. No significant disc herniation, spinal canal stenosis or neural foraminal narrowing.   L4-L5: No significant disc herniation, spinal canal stenosis or neural foraminal narrowing.   L5-S1: No significant disc herniation, spinal canal stenosis or neural foraminal narrowing.   IMPRESSION: 1. Technically successful lumbar CT myelogram without immediate post-procedure complication. 2. 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 Neurointerventional/neurosurgical consultation recommended. 3. Spondylosis at the lumbar and visualized lower thoracic levels as outlined within the body of the report, and most notably as follows. 4. At L2-L3, there is advanced disc space narrowing to the right (at the apex of a thoracolumbar levoscoliosis) with prominent degenerative endplate sclerosis. Disc bulge asymmetric to the right with disc osteophyte ridge extending into the right neural foramen, and endplate osteophytes along the right aspect of the disc space. Mild facet arthropathy. The disc bulge contributes to right subarticular stenosis with potential to affect descending right-sided nerve roots (most notably the descending right L3 nerve root). Mild central canal stenosis. Moderate right neural foraminal narrowing (with potential to affect the exiting right L2 nerve root). 5. No significant spinal canal or foraminal stenosis at the remaining levels.     Electronically Signed   By: Jackey Loge D.O.   On: 03/12/2023 12:05  I have personally reviewed the images and agree with the above interpretation.  Reviewed above with Dr. Katrinka Blazing. Will review thoracic CT once done and likely see patient back in office for repeat evaluation.

## 2023-03-12 NOTE — Procedures (Signed)
L4-L5 myelogram performed. Please see full dictation under the imaging tab in Epic.   Alwyn Ren, AGACNP-BC 03/12/2023, 11:00 AM

## 2023-03-12 NOTE — Telephone Encounter (Signed)
Radiology called to inform us that all CT and xray results are in.

## 2023-03-12 NOTE — Telephone Encounter (Signed)
Message from Alwyn Ren NP on secure chat:  She is here at [facility] for a CT lumbar myelogram and the radiologist sees something in the thoracic spine - a spinal dural AVF. They recommend a thoracic CT scan without contrast.   CT thoracic spine without contrast ordered.

## 2023-03-15 ENCOUNTER — Telehealth: Payer: Self-pay | Admitting: Orthopedic Surgery

## 2023-03-15 NOTE — Telephone Encounter (Signed)
Can schedule phone visit with me to review- please schedule this week.   As far as medications, looks like she is allergic to tramadol and codeine. Has she taken hydrocodone/vicodin in the past? Any issues with it?   Please let me know.

## 2023-03-15 NOTE — Telephone Encounter (Addendum)
Patient is calling that she had her CT myelogram on 03/12/2023. She states that she is in a lot of pain. She is having increased pain in her upper and lower part of her back. She has a totalof 6 injections to get her through the scan. She is taking tylenol and advil but it is not touching her pain. She is also still taking Gabapentin and methocarbamol. Is there anything else you can prescribe her? Once her results are in can she see Kennyth Arnold or MD? [facility]
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
## 2023-03-15 NOTE — Telephone Encounter (Signed)
She said that she would rather come in person. He does not want any medication that is considered a narcotic. She will wait and talk to you about this tomorrrow at 10am.

## 2023-03-16 ENCOUNTER — Ambulatory Visit (INDEPENDENT_AMBULATORY_CARE_PROVIDER_SITE_OTHER): Payer: Medicare HMO | Admitting: Orthopedic Surgery

## 2023-03-16 ENCOUNTER — Encounter: Payer: Self-pay | Admitting: Orthopedic Surgery

## 2023-03-16 VITALS — BP 128/82 | Ht 69.0 in | Wt 208.0 lb

## 2023-03-16 DIAGNOSIS — I770 Arteriovenous fistula, acquired: Secondary | ICD-10-CM

## 2023-03-16 DIAGNOSIS — R200 Anesthesia of skin: Secondary | ICD-10-CM | POA: Diagnosis not present

## 2023-03-16 DIAGNOSIS — M5136 Other intervertebral disc degeneration, lumbar region with discogenic back pain only: Secondary | ICD-10-CM

## 2023-03-16 DIAGNOSIS — M47816 Spondylosis without myelopathy or radiculopathy, lumbar region: Secondary | ICD-10-CM

## 2023-03-16 DIAGNOSIS — R202 Paresthesia of skin: Secondary | ICD-10-CM

## 2023-03-16 NOTE — Patient Instructions (Signed)
It was so nice to see you today. Thank you so much for coming in.   I am sorry the testing on Friday was so painful.   You have some wear and tear in your lower back that could be causing some of your pain.   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 This has do with the blood vessels in your spine. This could also be contributing to some of your symptoms.   I want you to see Dr. Johnsie Cancel at [facility]. He is a vascular neurosurgery- he specializes in things like this. His office should call you or you can call them at [phone].   We will call you in 4 weeks to check on you and see if you have seen him.   Please do not hesitate to call if you have any questions or concerns. You can also message me in MyChart.   Drake Leach PA-C [phone]     The physicians and staff at [facility] at [facility] are committed to providing excellent care. You may receive a survey asking for feedback about your experience at our office. We value you your feedback and appreciate you taking the time to to fill it out. The [facility] leadership team is also available to discuss your experience in person, feel free to contact us [phone].

## 2023-04-20 ENCOUNTER — Telehealth: Payer: Self-pay | Admitting: Orthopedic Surgery

## 2023-04-20 NOTE — Telephone Encounter (Signed)
 Yes they did receive the referral. They are waiting on images. Patient will be contacted in the next few days.

## 2023-04-20 NOTE — Telephone Encounter (Signed)
 I referred her to Dr. Valora Piccolo at [facility] at her last viit. Doesn't look like she has seen him yet. Please call her to see if she has an appt. If not, can we help her get one scheduled?   Thanks.

## 2023-07-20 ENCOUNTER — Ambulatory Visit
Admission: EM | Admit: 2023-07-20 | Discharge: 2023-07-20 | Disposition: A | Discharge status: Home / Self Care | Attending: Family Medicine | Admitting: Family Medicine

## 2023-07-20 ENCOUNTER — Ambulatory Visit (INDEPENDENT_AMBULATORY_CARE_PROVIDER_SITE_OTHER)

## 2023-07-20 DIAGNOSIS — M79671 Pain in right foot: Secondary | ICD-10-CM

## 2023-07-20 DIAGNOSIS — M7731 Calcaneal spur, right foot: Secondary | ICD-10-CM

## 2023-07-20 DIAGNOSIS — M7660 Achilles tendinitis, unspecified leg: Secondary | ICD-10-CM | POA: Diagnosis not present

## 2023-07-20 NOTE — Discharge Instructions (Addendum)
 May continue over-the-counter Tylenol  ibuprofen  as needed.  Elevate and ice as needed.  Please follow-up with podiatry for further treatment of your wounds.  Please go to ER if you develop any worsening symptoms but understanding patient with

## 2023-07-20 NOTE — ED Triage Notes (Signed)
 Pt c/o R heel pain x1 mon. Denies any falls or injuries.

## 2023-07-20 NOTE — ED Provider Notes (Addendum)
 [facility]    CSN: [id number] Arrival date & time: 07/20/23  1914      History   Chief Complaint Chief Complaint  Patient presents with   Foot Pain    HPI Anna Ayala is a 48 y.o. female presents for heel pain.  Patient with a past medical history of fibromyalgia, osteoarthritis who reports 1 month of persistent posterior right heel pain.  Pain is worse with standing/walking better with rest.  Denies any injury.  Swelling denies bruising or tingling.  No history of injuries or surgeries.  She has been trying Tylenol , ibuprofen  for   Foot Pain    Past Medical History:  Diagnosis Date   Aneurysm of anterior cerebral artery    Anxiety    Ataxia    COPD (chronic obstructive pulmonary disease) (HCC)    Depression    Dysarthria    Fibromyalgia    GERD (gastroesophageal reflux disease)    Head ache    Hypertension    Myalgia and myositis    Seizure (HCC)    "Seizure-like activity" per neuro   Stroke (HCC)    TIAs per pt. x6 last in2020. Some Right sided weakness   Tobacco abuse     There are no active problems to display for this patient.   Past Surgical History:  Procedure Laterality Date   BRAIN SURGERY     BROW LIFT Bilateral 05/29/2021   Procedure: BLEPHAROPLASTY UPPER EYLEID; W/EXCESS SKIN BLEPHAROPTOSIS REPAIR; RESECT EX BILATERAL;  Surgeon: Zacarias Hermann, MD;  Location: [facility];  Service: Ophthalmology;  Laterality: Bilateral;  Diabetic   ROBOTIC ASSISTED TOTAL HYSTERECTOMY WITH SACROCOLPOPEXY     supracervical hyst, with posterior repair    OB History     Gravida  4   Para      Term      Preterm      AB  2   Living  2      SAB  2   IAB      Ectopic      Multiple      Live Births  2            Home Medications    Prior to Admission medications   Medication Sig Start Date End Date Taking? Authorizing Provider  albuterol  (PROVENTIL ) (2.5 MG/3ML) 0.083% nebulizer solution Take 2.5 mg by nebulization every  6 (six) hours as needed for wheezing or shortness of breath.   Yes [provider]  albuterol  (VENTOLIN  HFA) 108 (90 Base) MCG/ACT inhaler Inhale into the lungs every 6 (six) hours as needed for wheezing or shortness of breath.   Yes [provider]  aspirin 325 MG tablet Take 325 mg by mouth daily.   Yes [provider]  cetirizine (ZYRTEC) 10 MG tablet Take 10 mg by mouth daily.   Yes [provider]  clindamycin -benzoyl peroxide (BENZACLIN) gel Apply topically two (2) times a day. 05/06/23 05/05/24 Yes [provider]  fluticasone (FLONASE) 50 MCG/ACT nasal spray Place 1 spray into both nostrils 2 (two) times daily.   Yes [provider]  fluticasone-salmeterol (ADVAIR) 500-50 MCG/ACT AEPB Inhale 1 puff into the lungs in the morning and at bedtime.   Yes [provider]  furosemide (LASIX) 40 MG tablet Take 40 mg by mouth.   Yes [provider]  gabapentin (NEURONTIN) 300 MG capsule Take 300 mg by mouth 3 (three) times daily.   Yes [provider]  ketoconazole (NIZORAL)  2 % cream Apply 1 application topically daily.   Yes [provider]  lamoTRIgine (LAMICTAL) 25 MG tablet Take by mouth 2 (two) times daily. 200 mg AM, 250 mg PM 08/03/20  Yes [provider]  lisinopril (ZESTRIL) 20 MG tablet Take 20 mg by mouth daily.   Yes [provider]  metFORMIN (GLUCOPHAGE) 500 MG tablet Take 500 mg by mouth daily with supper.   Yes [provider]  methocarbamol (ROBAXIN) 500 MG tablet Take 1-2 tablets by mouth 3 (three) times daily as needed. 06/23/20  Yes [provider]  mirabegron  ER (MYRBETRIQ ) 25 MG TB24 tablet Take 1 tablet (25 mg total) by mouth daily. 06/27/21  Yes Arma Lamp, MD  pantoprazole (PROTONIX) 40 MG tablet Take 80 mg by mouth daily.   Yes [provider]  pregabalin (LYRICA) 100 MG capsule Take 150 mg by mouth 2 (two) times daily. 150 mg AM, 300 mg  PM   Yes [provider]  propranolol (INDERAL) 20 MG tablet Take 40 mg by mouth 2 (two) times daily.   Yes [provider]  QUEtiapine (SEROQUEL) 50 MG tablet SMARTSIG:4 Tablet(s) By Mouth Every Night PRN 08/03/20  Yes [provider]  sertraline (ZOLOFT) 25 MG tablet Take 25 mg by mouth daily.   Yes [provider]  topiramate (TOPAMAX) 25 MG tablet Take 25 mg by mouth. 11/19/22  Yes [provider]  azithromycin  (ZITHROMAX  Z-PAK) 250 MG tablet Take 2 tablets on day 1 then 1 tablet daily 03/01/23   Brimage, Vondra, DO  promethazine -dextromethorphan (PROMETHAZINE -DM) 6.25-15 MG/5ML syrup Take 5 mLs by mouth 4 (four) times daily as needed. 03/01/23   Fidel Huddle, DO    Family History Family History  Adopted: Yes    Social History Social History   Tobacco Use   Smoking status: Every Day    Current packs/day: 1.50    Average packs/day: 1.5 packs/day for 34.0 years (51.0 ttl pk-yrs)    Types: Cigarettes   Smokeless tobacco: Never   Tobacco comments:    Started smoking around age 28  Vaping Use   Vaping status: Never Used  Substance Use Topics   Alcohol use: No   Drug use: Not Currently     Allergies   Bee venom, Chantix [varenicline], Spiriva handihaler [tiotropium bromide monohydrate], Tramadol, Diclofenac sodium, Codeine, Diclofenac, Quetiapine, and Tizanidine   Review of Systems Review of Systems  Musculoskeletal:        Right heel pain     Physical Exam Triage Vital Signs ED Triage Vitals  Encounter Vitals Group     BP 07/20/23 0839 (!) 133/90     Systolic BP Percentile --      Diastolic BP Percentile --      Pulse Rate 07/20/23 0839 85     Resp 07/20/23 0839 16     Temp 07/20/23 0839 98.5 F (36.9 C)     Temp Source 07/20/23 0839 Oral     SpO2 07/20/23 0839 96 %     Weight 07/20/23 0838 185 lb (83.9 kg)     Height 07/20/23 0838 5\' 9"  (1.753 m)     Head Circumference --      Peak Flow --      Pain Score 07/20/23  0843 10     Pain Loc --      Pain Education --      Exclude from Growth Chart --    No data found.  Updated Vital Signs BP Aaron Aas)  133/90 (BP Location: Left Arm)   Pulse 85   Temp 98.5 F (36.9 C) (Oral)   Resp 16   Ht 5\' 9"  (1.753 m)   Wt 185 lb (83.9 kg)   LMP 09/16/2016   SpO2 96%   BMI 27.32 kg/m   Visual Acuity Right Eye Distance:   Left Eye Distance:   Bilateral Distance:    Right Eye Near:   Left Eye Near:    Bilateral Near:     Physical Exam Vitals and nursing note reviewed.  Constitutional:      Appearance: Normal appearance.  HENT:     Head: Normocephalic and atraumatic.  Eyes:     Pupils: Pupils are equal, round, and reactive to light.  Cardiovascular:     Rate and Rhythm: Normal rate.  Pulmonary:     Effort: Pulmonary effort is normal.  Musculoskeletal:       Feet:  Feet:     Comments: Presents to clinic with complaint of right heel.  Tender to palpation to the posterior heel over Achilles tendon.  Full range of motion pain with dorsi flexion.  DP pulse 2. Skin:    General: Skin is warm and dry.  Neurological:     General: No focal deficit present.     Mental Status: She is alert and oriented to person, place, and time.  Psychiatric:        Mood and Affect: Mood normal.        Behavior: Behavior normal.      UC Treatments / Results  Labs (all labs ordered are listed, but only abnormal results are displayed) Labs Reviewed - No data to display  EKG   Radiology DG Os Calcis Right Result Date: 07/20/2023 CLINICAL DATA:  Pain posteriorly for 1 month.  No known injury. EXAM: RIGHT OS CALCIS - 2+ VIEW COMPARISON:  None Available. FINDINGS: Moderate plantar calcaneal spur. Moderate Achilles tendon enthesophyte with mild soft tissue thickening in the region of the Achilles insertion. No fracture. No erosion, periostitis or focal bone abnormality. Normal subtalar alignment. Slight skin irregularity in the posterior heel. No soft tissue gas or  radiopaque foreign body IMPRESSION: 1. Moderate plantar calcaneal spur. 2. Moderate Achilles tendon enthesophyte with mild soft tissue thickening in the region of the Achilles insertion suggesting Achilles tendinopathy. Electronically Signed   By: Chadwick Colonel M.D.   On: 07/20/2023 10:37    Procedures Procedures (including critical care time)  Medications Ordered in UC Medications - No data to display  Initial Impression / Assessment and Plan / UC Course  I have reviewed the triage vital signs and the nursing notes.  Pertinent labs & imaging results that were available during my care of the patient were reviewed by me and considered in my medical decision making (see chart for details).     Reviewed exam and symptoms with patient.  No red flags.  Patient is health department with obvious abnormality.  Will contact for any positive results based on radiology read available.  Discussed G[place]sbertus cause of symptoms.  Patient declined Rx naproxen and OTC treatments and also states she uses methocarbamol and gabapentin daily.  Will have patient follow-up with podiatry for follow-up/treatment options.  ER precautions reviewed and patient verbalized understanding. Final Clinical Impressions(s) / UC Diagnoses   Final diagnoses:  Pain of right heel  Calcaneal spur of right foot  Insertional Achilles tendinopathy     Discharge Instructions      May continue over-the-counter Tylenol  ibuprofen  as needed.  Elevate and ice as needed.  Please follow-up with podiatry for further treatment of your wounds.  Please go to ER if you develop any worsening symptoms but understanding patient with   ED Prescriptions   None    PDMP not reviewed this encounter.   Alleen Arbour, NP 07/20/23 1610    Alleen Arbour, NP 07/20/23 1057

## 2023-07-22 ENCOUNTER — Ambulatory Visit (HOSPITAL_COMMUNITY): Payer: Self-pay
